# Patient Record
Sex: Male | Born: 1990 | Race: Black or African American | Hispanic: No | Marital: Single | Smoking: Current some day smoker
Health system: Southern US, Community
[De-identification: ages and names within clinical notes are randomized; demographics above are authoritative.]

## PROBLEM LIST (undated history)

## (undated) ENCOUNTER — Emergency Department: Admission: EM | Payer: Self-pay | Source: Home / Self Care

## (undated) DIAGNOSIS — B20 Human immunodeficiency virus [HIV] disease: Secondary | ICD-10-CM

## (undated) HISTORY — PX: WISDOM TOOTH EXTRACTION: SHX21

---

## 2011-11-28 DIAGNOSIS — R7303 Prediabetes: Secondary | ICD-10-CM | POA: Insufficient documentation

## 2011-11-28 DIAGNOSIS — Z21 Asymptomatic human immunodeficiency virus [HIV] infection status: Secondary | ICD-10-CM | POA: Insufficient documentation

## 2012-08-14 ENCOUNTER — Encounter (HOSPITAL_COMMUNITY): Payer: Self-pay | Admitting: *Deleted

## 2012-08-14 ENCOUNTER — Emergency Department (HOSPITAL_COMMUNITY)
Admission: EM | Admit: 2012-08-14 | Discharge: 2012-08-14 | Disposition: A | Discharge status: Home / Self Care | Payer: Managed Care, Other (non HMO) | Attending: Emergency Medicine | Admitting: Emergency Medicine

## 2012-08-14 DIAGNOSIS — F17200 Nicotine dependence, unspecified, uncomplicated: Secondary | ICD-10-CM | POA: Insufficient documentation

## 2012-08-14 DIAGNOSIS — L0231 Cutaneous abscess of buttock: Secondary | ICD-10-CM

## 2012-08-14 MED ORDER — HYDROCODONE-ACETAMINOPHEN 5-325 MG PO TABS
1.0000 | ORAL_TABLET | ORAL | Status: DC | PRN
Start: 2012-08-14 — End: 2012-09-02

## 2012-08-14 NOTE — ED Provider Notes (Signed)
Medical screening examination/treatment/procedure(s) were performed by non-physician practitioner and as supervising physician I was immediately available for consultation/collaboration.  Olivia Mackie, MD 08/14/12 [phone]

## 2012-08-14 NOTE — ED Notes (Signed)
PA Peter at bedside.  

## 2012-08-14 NOTE — ED Provider Notes (Signed)
History     CSN: [id number]  Arrival date & time 08/14/12  0000   First MD Initiated Contact with Patient 08/14/12 0017      Chief Complaint  Patient presents with  . Abscess   HPI  History provided by the patient. Patient is a 22 year old male with no significant PMH who presents with complaints of a boil to his right buttocks. Patient states symptoms first began about a week ago and gradually progressing into a increasing swelling and painful area. Patient states he has had a previous abscess in the past but that this went away after using warm compresses over several days. His current symptoms have not improved with multiple times of warm compresses. Patient denies having any bleeding or drainage from the area. He denies any other aggravating or alleviating factors. Denies any other associated symptoms. Denies fever, chills or sweats.    History reviewed. No pertinent past medical history.  History reviewed. No pertinent past surgical history.  No family history on file.  History  Substance Use Topics  . Smoking status: Current Some Day Smoker  . Smokeless tobacco: Not on file  . Alcohol Use: Yes      Review of Systems  Constitutional: Negative for fever and chills.  All other systems reviewed and are negative.    Allergies  Review of patient's allergies indicates no known allergies.  Home Medications  No current outpatient prescriptions on file.  BP 135/80  Pulse 109  Temp(Src) 98.9 F (37.2 C)  Resp 12  SpO2 98%  Physical Exam  Nursing note and vitals reviewed. Constitutional: He is oriented to person, place, and time. He appears well-developed and well-nourished. No distress.  HENT:  Head: Normocephalic.  Eyes: Conjunctivae are normal.  Cardiovascular: Normal rate and regular rhythm.   Pulmonary/Chest: Effort normal and breath sounds normal.  Musculoskeletal: Normal range of motion.  Neurological: He is alert and oriented to person, place, and time.   Skin: Skin is warm and dry.  4 cm nodular area to the right lateral buttocks with overlying erythema and induration of the skin. Bleeding or drainage.  Psychiatric: He has a normal mood and affect. His behavior is normal.    ED Course  Procedures   INCISION AND DRAINAGE Performed by: Angus Seller Consent: Verbal consent obtained. Risks and benefits: risks, benefits and alternatives were discussed Type: abscess  Body area: Right lateral buttock  Anesthesia: local infiltration  Incision was made with a scalpel.  Local anesthetic: lidocaine percent with epinephrine  Anesthetic total: 6 ml  Complexity: complex Blunt dissection to break up loculations  Drainage: purulent  Drainage amount: Small to moderate   Packing material: None   Patient tolerance: Patient tolerated the procedure well with no immediate complications.       1. Abscess of right buttock       MDM  1:00AM patient seen and evaluated. Patient appears well in no acute distress.  Successful I&D. No significant signs of cellulitis at this time. Will advise patient continue warm compresses and have a recheck of symptoms and 48 hours. Patient also given strict return precautions for worsening symptoms.        Angus Seller, [place] 08/14/12 [phone]

## 2012-08-14 NOTE — ED Notes (Signed)
Boil on rt. Lateral side of butt. 2nd time this month. States, "can it be r/t to drinking juice and no water."

## 2012-09-02 ENCOUNTER — Emergency Department (HOSPITAL_COMMUNITY)
Admission: EM | Admit: 2012-09-02 | Discharge: 2012-09-02 | Disposition: A | Discharge status: Home / Self Care | Payer: Managed Care, Other (non HMO) | Attending: Emergency Medicine | Admitting: Emergency Medicine

## 2012-09-02 ENCOUNTER — Encounter (HOSPITAL_COMMUNITY): Payer: Self-pay | Admitting: *Deleted

## 2012-09-02 DIAGNOSIS — R112 Nausea with vomiting, unspecified: Secondary | ICD-10-CM | POA: Insufficient documentation

## 2012-09-02 DIAGNOSIS — K3189 Other diseases of stomach and duodenum: Secondary | ICD-10-CM | POA: Insufficient documentation

## 2012-09-02 DIAGNOSIS — R1084 Generalized abdominal pain: Secondary | ICD-10-CM | POA: Insufficient documentation

## 2012-09-02 DIAGNOSIS — F17200 Nicotine dependence, unspecified, uncomplicated: Secondary | ICD-10-CM | POA: Insufficient documentation

## 2012-09-02 DIAGNOSIS — K30 Functional dyspepsia: Secondary | ICD-10-CM

## 2012-09-02 MED ORDER — GI COCKTAIL ~~LOC~~
30.0000 mL | Freq: Once | ORAL | Status: AC
  Administered 2012-09-02: 30 mL via ORAL
  Filled 2012-09-02: qty 30

## 2012-09-02 MED ORDER — ONDANSETRON 4 MG PO TBDP
8.0000 mg | ORAL_TABLET | Freq: Once | ORAL | Status: AC
  Administered 2012-09-02: 8 mg via ORAL
  Filled 2012-09-02: qty 2

## 2012-09-02 MED ORDER — ONDANSETRON 4 MG PO TBDP: 8.0000 mg | ORAL_TABLET | Freq: Three times a day (TID) | ORAL | Status: DC | PRN

## 2012-09-02 MED ORDER — DICYCLOMINE HCL 20 MG PO TABS: 20.0000 mg | ORAL_TABLET | Freq: Two times a day (BID) | ORAL | Status: DC

## 2012-09-02 MED ORDER — SODIUM CHLORIDE 0.9 % IV BOLUS (SEPSIS)
1000.0000 mL | Freq: Once | INTRAVENOUS | Status: AC
  Administered 2012-09-02: 1000 mL via INTRAVENOUS

## 2012-09-02 NOTE — ED Provider Notes (Signed)
Medical screening examination/treatment/procedure(s) were performed by non-physician practitioner and as supervising physician I was immediately available for consultation/collaboration.  Olivia Mackie, MD 09/02/12 [phone]

## 2012-09-02 NOTE — ED Notes (Signed)
Pt resting laying on stretcher, family member asking for pt's discharge paperwork, informed family member will have RN to come and speak with them

## 2012-09-02 NOTE — ED Notes (Signed)
The pt has a headache with nv and diarrhea tonight

## 2012-09-02 NOTE — ED Provider Notes (Signed)
History     CSN: [id number]  Arrival date & time 09/02/12  0435   First MD Initiated Contact with Patient 09/02/12 0606      Chief Complaint  Patient presents with  . Headache    (Consider location/radiation/quality/duration/timing/severity/associated sxs/prior treatment) HPI Comments: Patient is a 22 year old male who presents with a history of abdominal pain since last night. The pain is located in his generalized abdomen and does not radiate radiate. The pain is described as cramping and severe. The pain started gradually after he took a BC powder for a headache on an empty stomach and progressively worsened since the onset. No alleviating/aggravating factors. The patient has tried nothing for symptoms without relief. Associated symptoms include nausea and vomiting. Patient denies fever, headache, diarrhea, chest pain, SOB, dysuria, constipation.   History reviewed. No pertinent past medical history.  History reviewed. No pertinent past surgical history.  No family history on file.  History  Substance Use Topics  . Smoking status: Current Some Day Smoker  . Smokeless tobacco: Not on file  . Alcohol Use: Yes      Review of Systems  Gastrointestinal: Positive for nausea, vomiting and abdominal pain.  All other systems reviewed and are negative.    Allergies  Review of patient's allergies indicates no known allergies.  Home Medications  No current outpatient prescriptions on file.  BP 132/73  Pulse 107  Temp(Src) 99.3 F (37.4 C) (Oral)  Resp 16  SpO2 97%  Physical Exam  Nursing note and vitals reviewed. Constitutional: He is oriented to person, place, and time. He appears well-developed and well-nourished. No distress.  HENT:  Head: Normocephalic and atraumatic.  Eyes: Conjunctivae and EOM are normal. Pupils are equal, round, and reactive to light. No scleral icterus.  Neck: Normal range of motion. Neck supple.  Cardiovascular: Normal rate and regular  rhythm.  Exam reveals no gallop and no friction rub.   No murmur heard. Pulmonary/Chest: Effort normal and breath sounds normal. He has no wheezes. He has no rales. He exhibits no tenderness.  Abdominal: Soft. He exhibits no distension. There is tenderness. There is no rebound and no guarding.  Generalized tenderness to palpation.   Musculoskeletal: Normal range of motion.  Neurological: He is alert and oriented to person, place, and time. Coordination normal.  Speech is goal-oriented. Moves limbs without ataxia.   Skin: Skin is warm and dry.  Psychiatric: He has a normal mood and affect. His behavior is normal.    ED Course  Procedures (including critical care time)  Labs Reviewed - No data to display No results found.   1. Stomach upset       MDM  6:24 AM Patient will have IV fluids, zofran, and GI cocktail. Patient tachycardic and afebrile at this time.   8:23 AM Patient feeling better and is ready for discharge. Vitals stable and patient afebrile. Patient likely experienced irritation due to taking BC powder on an empty stomach. No further evaluation needed at this time. Patient instructed to return with worsening or concerning symptoms.       Emilia Beck, [place] 09/02/12 [phone]

## 2013-06-11 ENCOUNTER — Encounter (HOSPITAL_COMMUNITY): Payer: Self-pay | Admitting: Emergency Medicine

## 2013-06-11 ENCOUNTER — Emergency Department (HOSPITAL_COMMUNITY)
Admission: EM | Admit: 2013-06-11 | Discharge: 2013-06-11 | Disposition: A | Discharge status: Home / Self Care | Payer: Managed Care, Other (non HMO) | Attending: Emergency Medicine | Admitting: Emergency Medicine

## 2013-06-11 DIAGNOSIS — F17200 Nicotine dependence, unspecified, uncomplicated: Secondary | ICD-10-CM | POA: Insufficient documentation

## 2013-06-11 DIAGNOSIS — K602 Anal fissure, unspecified: Secondary | ICD-10-CM | POA: Insufficient documentation

## 2013-06-11 DIAGNOSIS — Z79899 Other long term (current) drug therapy: Secondary | ICD-10-CM | POA: Insufficient documentation

## 2013-06-11 MED ORDER — DILTIAZEM GEL 2 %: 1.0000 "application " | Freq: Two times a day (BID) | CUTANEOUS | Status: DC

## 2013-06-11 MED ORDER — PSYLLIUM 28 % PO PACK: 1.0000 | PACK | Freq: Two times a day (BID) | ORAL | Status: DC

## 2013-06-11 MED ORDER — NAPROXEN 500 MG PO TABS: 500.0000 mg | ORAL_TABLET | Freq: Two times a day (BID) | ORAL | Status: DC | PRN

## 2013-06-11 NOTE — ED Notes (Signed)
Pt states he put veet on his anal area 3 days ago and has since had rash. Went to urgent care and was prescribed prednisone and a prescription cream. States it is not helping.

## 2013-06-11 NOTE — ED Provider Notes (Signed)
CSN: [id number]     Arrival date & time 06/11/13  0272 History   First MD Initiated Contact with Patient 06/11/13 [phone]     Chief Complaint  Patient presents with  . Rash   (Consider location/radiation/quality/duration/timing/severity/associated sxs/prior Treatment) HPI  22yM with "a rash on my booty hole." Onset 3-4 days ago. Very painful and significantly worse when defecating. No fever or chills. No drainage. No urinary complaints. No abdominal or back pain. Went to Urgent Care two days ago and given prescription for prednisone and a cream. He is not sure what specifically the cream was nor the diagnosis he was given. Symptoms have not improved.   History reviewed. No pertinent past medical history. History reviewed. No pertinent past surgical history. History reviewed. No pertinent family history. History  Substance Use Topics  . Smoking status: Current Some Day Smoker  . Smokeless tobacco: Not on file  . Alcohol Use: Yes    Review of Systems  All systems reviewed and negative, other than as noted in HPI.   Allergies  Review of patient's allergies indicates no known allergies.  Home Medications   Current Outpatient Rx  Name  Route  Sig  Dispense  Refill  . diltiazem 2 % GEL   Topical   Apply 1 application topically 2 (two) times daily.   30 g   0   . naproxen (NAPROSYN) 500 MG tablet   Oral   Take 1 tablet (500 mg total) by mouth 2 (two) times daily as needed.   20 tablet   0   . psyllium (METAMUCIL SMOOTH TEXTURE) 28 % packet   Oral   Take 1 packet by mouth 2 (two) times daily.   30 packet   0    BP 131/105  Pulse 89  Temp(Src) 97.7 F (36.5 C) (Oral)  Resp 16  SpO2 96% Physical Exam  Nursing note and vitals reviewed. Constitutional: He appears well-developed and well-nourished. No distress.  HENT:  Head: Normocephalic and atraumatic.  Eyes: Conjunctivae are normal. Right eye exhibits no discharge. Left eye exhibits no discharge.  Neck: Neck  supple.  Cardiovascular: Normal rate, regular rhythm and normal heart sounds.  Exam reveals no gallop and no friction rub.   No murmur heard. Pulmonary/Chest: Effort normal and breath sounds normal. No respiratory distress.  Abdominal: Soft. He exhibits no distension. There is no tenderness.  Genitourinary:  Anal fissure at 12 o'clock position. No concerning lesions noted otherwise. Unable to tolerate DRE 2/2 pain.   Musculoskeletal: He exhibits no edema and no tenderness.  Neurological: He is alert.  Skin: Skin is warm and dry.  Psychiatric: He has a normal mood and affect. His behavior is normal. Thought content normal.    ED Course  Procedures (including critical care time) Labs Review Labs Reviewed - No data to display Imaging Review No results found.  EKG Interpretation   None       MDM   1. Anal fissure    22yM with anal pain. Exam consistent with anal fissure. Unsure of reason for previously prescribed prednisone. Instructed to stop as well as "cream" he was given. Additional scripts for bulking agent, NSAID, diltiazem ointment. Sitz baths. Return precautions discussed. Resource list provided.     Raeford Razor, MD 06/11/13 [phone]

## 2013-06-14 ENCOUNTER — Encounter (HOSPITAL_COMMUNITY): Payer: Self-pay | Admitting: Emergency Medicine

## 2013-06-14 ENCOUNTER — Emergency Department (HOSPITAL_COMMUNITY)
Admission: EM | Admit: 2013-06-14 | Discharge: 2013-06-14 | Disposition: A | Discharge status: Home / Self Care | Payer: Managed Care, Other (non HMO) | Attending: Emergency Medicine | Admitting: Emergency Medicine

## 2013-06-14 DIAGNOSIS — IMO0002 Reserved for concepts with insufficient information to code with codable children: Secondary | ICD-10-CM | POA: Insufficient documentation

## 2013-06-14 DIAGNOSIS — F17200 Nicotine dependence, unspecified, uncomplicated: Secondary | ICD-10-CM | POA: Insufficient documentation

## 2013-06-14 DIAGNOSIS — Z792 Long term (current) use of antibiotics: Secondary | ICD-10-CM | POA: Insufficient documentation

## 2013-06-14 DIAGNOSIS — Z79899 Other long term (current) drug therapy: Secondary | ICD-10-CM | POA: Insufficient documentation

## 2013-06-14 DIAGNOSIS — R21 Rash and other nonspecific skin eruption: Secondary | ICD-10-CM | POA: Insufficient documentation

## 2013-06-14 DIAGNOSIS — K6289 Other specified diseases of anus and rectum: Secondary | ICD-10-CM | POA: Insufficient documentation

## 2013-06-14 MED ORDER — MUPIROCIN CALCIUM 2 % EX CREA
TOPICAL_CREAM | Freq: Once | CUTANEOUS | Status: AC
  Administered 2013-06-14: 15:00:00 via TOPICAL
  Filled 2013-06-14: qty 15

## 2013-06-14 MED ORDER — MUPIROCIN CALCIUM 2 % EX CREA: TOPICAL_CREAM | Freq: Two times a day (BID) | CUTANEOUS | Status: DC

## 2013-06-14 NOTE — ED Notes (Signed)
Pt was seen 1 week ago for using VEET shaving cream and was given a cream for this and he is still having burning to penis and rectal area.

## 2013-06-14 NOTE — ED Provider Notes (Signed)
CSN: [id number]     Arrival date & time 06/14/13  1324 History  This chart was scribed for Earley Favor, NP working with Shon Baton, MD by Smiley Houseman, ED Scribe. This patient was seen in room WTR6/WTR6 and the patient's care was started at 1:45PM.    Chief Complaint  Patient presents with  . Anal Rash   The history is provided by the patient. No language interpreter was used.   HPI Comments: Walter Ferguson is a 22 y.o. male who presents to the Emergency Department complaining of pain in his anal area.  Pt states he applied Veet to his anal area about a week ago and developed rash and burning sensations.  He was previously prescribed Prednisone and a topical ointment at [facility] for the rash without relief.    Pt states he has had normal BM.  She continues to have a burning sensation in his rectal area  History reviewed. No pertinent past medical history. History reviewed. No pertinent past surgical history. No family history on file. History  Substance Use Topics  . Smoking status: Current Some Day Smoker  . Smokeless tobacco: Not on file  . Alcohol Use: Yes    Review of Systems  Gastrointestinal: Positive for rectal pain. Negative for diarrhea.  Genitourinary: Negative for dysuria.  Skin: Positive for rash.    Allergies  Review of patient's allergies indicates no known allergies.  Home Medications   Current Outpatient Rx  Name  Route  Sig  Dispense  Refill  . diltiazem 2 % GEL   Topical   Apply 1 application topically 2 (two) times daily.   30 g   0   . naproxen (NAPROSYN) 500 MG tablet   Oral   Take 1 tablet (500 mg total) by mouth 2 (two) times daily as needed.   20 tablet   0   . triamcinolone cream (KENALOG) 0.1 %   Topical   Apply 1 application topically 2 (two) times daily.         . mupirocin cream (BACTROBAN) 2 %   Topical   Apply topically 2 (two) times daily.   15 g   0    Triage Vitals: BP 129/99  Pulse 100  Temp(Src) 97.2 F (36.2 C)  (Oral)  Resp 18  SpO2 100% Physical Exam  Nursing note and vitals reviewed. Constitutional: He appears well-developed and well-nourished.  HENT:  Head: Normocephalic.  Eyes: Pupils are equal, round, and reactive to light.  Neck: Normal range of motion.  Cardiovascular: Normal rate and regular rhythm.   Pulmonary/Chest: Effort normal.  Genitourinary:     Generalized erythema of the anus  Neurological: He is alert.  Skin: Rash noted. There is erythema.    ED Course  Procedures (including critical care time) DIAGNOSTIC STUDIES: Oxygen Saturation is 100% on RA, normal by my interpretation.    COORDINATION OF CARE: 1:33 PM-Will order Bactroban. Patient informed of current plan of treatment and evaluation and agrees with plan.    Labs Review Labs Reviewed - No data to display Imaging Review No results found.  EKG Interpretation   None       MDM   1. Anal inflammation     Use topical Bactroban advised patient to refrain from intercourse until abrasion is healed  I personally performed the services described in this documentation, which was scribed in my presence. The recorded information has been reviewed and is accurate.   Arman Filter, NP 06/14/13 1414

## 2013-06-14 NOTE — ED Provider Notes (Signed)
Medical screening examination/treatment/procedure(s) were performed by non-physician practitioner and as supervising physician I was immediately available for consultation/collaboration.  EKG Interpretation   None        Courtney F Horton, MD 06/14/13 1838 

## 2013-10-25 ENCOUNTER — Emergency Department: Payer: Medicaid Other

## 2013-10-25 ENCOUNTER — Emergency Department
Admission: EM | Admit: 2013-10-25 | Discharge: 2013-10-25 | Disposition: A | Discharge status: Home / Self Care | Payer: Medicaid Other | Attending: Emergency Medicine | Admitting: Emergency Medicine

## 2013-10-25 DIAGNOSIS — R21 Rash and other nonspecific skin eruption: Secondary | ICD-10-CM | POA: Insufficient documentation

## 2013-10-25 MED ORDER — IBUPROFEN 600 MG PO TABS
600.0000 mg | ORAL_TABLET | Freq: Four times a day (QID) | ORAL | Status: AC | PRN
Start: 2013-10-25 — End: ?

## 2013-10-25 NOTE — ED Provider Notes (Signed)
Physician/Midlevel provider first contact with patient: 10/25/13 1346         [facility] EMERGENCY DEPARTMENT   HISTORY AND PHYSICAL EXAM     Physician/Midlevel provider first contact with patient: 10/25/13 1346       Date Time: 10/25/2013 11:51 PM  Patient Name: Walter Young  Attending Physician: Ames Dura, DO  Mid-Level: Timmothy Sours    History of Presenting Illness:   Walter Young is a 23 y.o. male     Chief Complaint: burning rash around anus  History obtained from: Patient .  Onset/Duration: on/off 4 months, worse 2 days   Quality: burning  Severity: mild to moderate  Aggravating Factors: skin rubbing on itself while walking  Alleviating Factors: none  Associated Symptoms: none   Narrative/Additional Historical Findings:22 y.o. male with rash on anus treated first in 4-5 months ago with cream (pt does not know name of cream).  Pt states he noted a rash again 2 days ago which stings and burns when he walks and the skin of his buttocks rubs together.  Denies f/c, n/v/d, penile discharge, dysuria, abd pain, rectal pain, constipation, pain with BM.  Pt has sex with men, insertive and denies receptive anal sex.  Pt has been using OTC spray anesthetic around anus.     PCP: Christa See, MD    Past Medical History:   History reviewed. No pertinent past medical history.    Past Surgical History:   History reviewed. No pertinent past surgical history.    Family History:   History reviewed. No pertinent family history.    Social History:     History     Social History   . Marital Status: Single     Spouse Name: N/A     Number of Children: N/A   . Years of Education: N/A     Social History Main Topics   . Smoking status: Never Smoker    . Smokeless tobacco: Never Used   . Alcohol Use: Yes      Comment: socially   . Drug Use: No   . Sexually Active: Not on file     Other Topics Concern   . Not on file     Social History Narrative   . No narrative on file       Allergies:   No Known  Allergies    Medications:     Patient's Medications   New Prescriptions    IBUPROFEN (ADVIL,MOTRIN) 600 MG TABLET    Take 1 tablet (600 mg total) by mouth every 6 (six) hours as needed for Pain or Fever.   Previous Medications    No medications on file   Modified Medications    No medications on file   Discontinued Medications    No medications on file       Review of Systems:     Review of Systems   Constitutional: Negative for fever.   HENT: Negative for sore throat.    Eyes: Negative for pain, discharge and redness.   Respiratory: Negative for cough, shortness of breath and wheezing.    Cardiovascular: Negative for chest pain and palpitations.   Gastrointestinal: Negative for nausea, vomiting, abdominal pain, diarrhea, constipation and blood in stool.   Genitourinary: Negative for dysuria.        No penile discharge or lesions   Musculoskeletal: Negative for back pain, myalgias and neck pain.   Skin: Positive for rash. Negative for itching.  perianal   Neurological: Negative for dizziness, tingling and headaches.         Physical Exam:     Filed Vitals:    10/25/13 1303   BP: 130/76   Pulse: 96   Temp: 98 F (36.7 C)   TempSrc: Oral   Resp: 18   Weight: 57.607 kg   SpO2: 98%     Pulse Oximetry Analysis - Normal 98% on RA    Physical Exam   Nursing note and vitals reviewed.  Constitutional: He is oriented to person, place, and time and well-developed, well-nourished, and in no distress. No distress.   HENT:   Head: Normocephalic.   Right Ear: Tympanic membrane normal.   Left Ear: Tympanic membrane normal.   Mouth/Throat: Uvula is midline, oropharynx is clear and moist and mucous membranes are normal.   Eyes: Conjunctivae normal are normal. Right eye exhibits no discharge. Left eye exhibits no discharge.   Neck: Normal range of motion. Neck supple.   Cardiovascular: Normal rate and regular rhythm.    Pulmonary/Chest: Effort normal and breath sounds normal.   Abdominal: Soft. There is no tenderness.    Genitourinary: Penis normal. Rectal exam shows no external hemorrhoid, no fissure and no tenderness. Penis exhibits no lesions. No discharge found.        2-3 mm superficial skin excoriation just outside anus, no vesicles, no drainage, nontender.     Musculoskeletal: Normal range of motion. He exhibits no edema and no tenderness.   Lymphadenopathy:     He has no cervical adenopathy.   Neurological: He is alert and oriented to person, place, and time. Gait normal.   Skin: Skin is warm and dry. He is not diaphoretic.        See GU exam   Psychiatric: Affect normal.         Labs:     Results     ** No Results found for the last 24 hours. **          Rads:     Radiology Results (24 Hour)     ** No Results found for the last 24 hours. **              Provider Notes:  Perianal skin excoriation.  Recommend to stop using OTC spray anesthetic on anal skin.  Sitz baths TID, dry area well, antibiotic oint and Desitin.  F/u with primary care provider if not improved in 3-5 days.      Patient counseled on diagnosis, treatment plan, f/u plans, and signs and symptoms with which to return to ED. Pt is stable for discharge.    Assessment/Plan:     1. Rash            CHART OWNERSHIP: I, Ok Edwards, PA-C, am the primary clinician of record.        Signed by: Ok Edwards, PA-C          Timmothy Sours, PA  10/25/13 2358    Ames Dura, DO  11/01/13 2111

## 2013-10-25 NOTE — Discharge Instructions (Signed)
Thank you for choosing [facility] for your emergency care needs.      We appreciate your patience today.     Please feel free to contact us if you have any further questions.     Sitz baths 3 times a day.  Dry area well, gently, with hair dryer on cool setting.  Apply antibiotic oint and Desitin.      Follow up with primary provider if not improved in 3-5 days.  See list attached.     Rash, Nonspecific    You have been seen today for a rash.    The rash does not have a specific appearance or cause that would let the medical staff give a definite diagnosis or treatment now.    There are many causes for rashes to appear on the skin. The medical staff will have talked about some of the many causes. Some causes may be: Allergic reactions, chemical irritation of the skin or infections. A rash alone with no other symptoms is rarely harmful.    If you have some itching you may try an oral (by mouth) antihistamine like diphenhydramine (Benadryl). This is available over the counter (without prescription). Follow the directions and precautions on the medicine packaging.    Follow up with your family doctor or clinic for reevaluation of the rash if it does not clear up in two or three days.    Sometimes, you will need to see a Dermatologist (a skin specialist doctor) for help finding the cause of the rash.    It is OK for you to go home now.    Even though it may be hard, try not to scratch the affected area. A cool wet cloth can help relieve itching and keep you from scratching.    YOU SHOULD SEEK MEDICAL ATTENTION IMMEDIATELY, EITHER HERE OR AT THE NEAREST EMERGENCY DEPARTMENT, IF ANY OF THE FOLLOWING OCCURS:   Rash spreads or gets worse.   Severe itching, pain or burning in the skin.   Blistering, bruising or bleeding skin.   Fever (temperature higher than 100.18F / 38C), headache, vomiting (throwing up).   Any new symptoms which are of concern to you.   Any signs of infection in the skin develop.  This includes more redness, pain, pus drainage, fevers (temperature higher than 100.18F / 38C) or swelling.       Low Cost Healthcare Resources in [facility]/Federally Qualified Health Centers    [facility]   www.https://www.krueger.org/    [facility]ult [facility]  [address]  [place], [place] [address]   [phone]    Pediatrics, [address] [place], [place] [address], [phone]    [facility], [phone]     ---------------------------------------------------------------------------------------  [facility], [address] [place], [place] [address], [phone]  [email or web address].pl    [facility], [address], [place], [place] [address], [phone]         ---------------------------------------------------------------------------------------  (Greater) [facility], [address], [place], [place] [address], [phone], [address] [place], [place] [address], [phone]    ---------------------------------------------------------------------------------------    Health Departments    ---------------------------------------------------------------------------------------    [facility], [address] [place], [place] [address], [phone]  www.[email or web address]    [facility], [address], [place],  [place] [address], [phone]  [email or web address]    ---------------------------------------------------------------------------------------  [facility], [address], [place] [address], [phone]    ---------------------------------------------------------------------------------------  [facility], [address], [place], [place] [address], [phone]    [facility], [address], [place], [place] [address], [phone]  [email or web address]    [facility], [address] [place], [place] [address], [phone], [address], [place], [place] [address], [phone]    [facility], [address]st Floor, [place], [place] [address], [phone]    ---------------------------------------------------------------------------------------  [facility], [address] 1st Floor, [place], [place] [address], [phone]    ---------------------------------------------------------------------------------------  [facility], [address], [place], [place] [address], [phone]    [facility], [phone]    ---------------------------------------------------------------------------------------  [facility], [address], [place], [place] [address], [phone]  [facility], [address], [place], [place] [address], [phone]    [facility], [address] [place], [place] [address], [phone], [address] [place], [place], [place] [address], [phone]    ---------------------------------------------------------------------------------------    Free Clinics    ---------------------------------------------------------------------------------------  ---------------------------------------------------------------------------------------  [facility],   InvestmentInstructor.be    [facility],  http://garcia-smith.com/    [facility],   MobileCamcorder.com.br    [facility], www.TanPlex.uy    [employer], www.vafreeclinics.org/[place]-free-clinics.asp#culc    ---------------------------------------------------------------------------------------    Other Resources    ---------------------------------------------------------------------------------------  ---------------------------------------------------------------------------------------  [facility], [address], [place], [place] [address], [phone]  www.[email or web address].[facility], [address], [address] [place], [place] [address], [phone]  MakePoetry.hu    [facility], [address] [place], [place], [place] [address], [phone]    ---------------------------------------------------------------------------------------    Dental    ---------------------------------------------------------------------------------------  ---------------------------------------------------------------------------------------  [facility], [address], [address] [place], [place] [address], [phone]    [facility], [address], 1st Floor, [place], [place] [address], [phone]  By appointment only    [facility]  [address] [place], [place] [address], [phone]    [facility]  [address] [place], [place], [phone]  [address], [place], [place], [phone]  [address], [place]. [place], [place], [phone]    [facility], [address], [place], [place], [phone]       ---------------------------------------------------------------------------------------  Health Insurance Locators    ---------------------------------------------------------------------------------------  ---------------------------------------------------------------------------------------  [employer] for Health Coverage Education,  http://www.wolf.info/    EHealthInsurance,   http://www.ehealthinsurance.com    [employer],   http://www.healthinsurancesort.com    Health Insurance Online,   http://www.online-health-insurance.com    Health Plan One,   http://www.healthplanone.com    http://www.long-jenkins.com/,    http://www.healthcare.gov    [employer], http://www.novaclinics.org/home  [facility])    ---------------------------------------------------------------------------------------    Medication Resources    ---------------------------------------------------------------------------------------  ---------------------------------------------------------------------------------------  [employer], http://www.fairfaxrxdiscountcard.com, [phone], [address] Helpdesk    Partnership for Prescriptions Assistance,  http://www.pparx.org    NeedyMeds,       http://www.needymeds.[employer],    [phone]    [employer],      http://rojas.com/.htm  [employer] of [employer] (NAIC),      http://www.insureuonline.org    Health Insurance Info Net,     OregonTravelAgents.com.pt      Clinics: STD Testing (free)    Walk-In Hours    [facility] - Monday 9:30 a.m. - 11:00 a.m.     [facility] - Thursday 1:30 p.m.- 3 p.m.     [facility] -   Tuesday 10 a.m. - 11:30 a.m.  Wednesday 5 p.m. - 6:30 p.m.     [facility] -   Monday - 2 p.m.- 3:30 p.m.  Friday - 9:30 a.m. - 11 a.m.     [facility] - Tuesday - 4:30 p.m. - 6 p.m.     Anonymous HIV Testing  Geannie Risen -   First and Third Wednesday 5 p.m. - 6:30 p.m.       [facility] Office:   [address]  [place] [address]  [phone]   [facility]:  [address]  [place], [place] [address]  [phone]     [facility]:  [address]  [place], [place] [address]  [phone]     [facility]  [address]  [place], [place] [address]  [phone]     [facility] Office:   [employer], First Floor, [address]  [address] [place], [place] [address]  [phone]     For more information: http://www.DatingOpportunities.is.htm

## 2013-10-25 NOTE — ED Notes (Signed)
Patient c/o rectal irritation for the last three days. Patient is ao x 3.

## 2014-05-13 ENCOUNTER — Encounter (HOSPITAL_COMMUNITY): Payer: Self-pay | Admitting: Family Medicine

## 2014-05-13 ENCOUNTER — Emergency Department (HOSPITAL_COMMUNITY)
Admission: EM | Admit: 2014-05-13 | Discharge: 2014-05-13 | Disposition: A | Discharge status: Home / Self Care | Payer: Managed Care, Other (non HMO) | Attending: Emergency Medicine | Admitting: Emergency Medicine

## 2014-05-13 DIAGNOSIS — T8089XA Other complications following infusion, transfusion and therapeutic injection, initial encounter: Secondary | ICD-10-CM | POA: Diagnosis not present

## 2014-05-13 DIAGNOSIS — Y848 Other medical procedures as the cause of abnormal reaction of the patient, or of later complication, without mention of misadventure at the time of the procedure: Secondary | ICD-10-CM | POA: Diagnosis not present

## 2014-05-13 DIAGNOSIS — R52 Pain, unspecified: Secondary | ICD-10-CM

## 2014-05-13 DIAGNOSIS — M25551 Pain in right hip: Secondary | ICD-10-CM | POA: Diagnosis not present

## 2014-05-13 DIAGNOSIS — Z720 Tobacco use: Secondary | ICD-10-CM | POA: Diagnosis not present

## 2014-05-13 DIAGNOSIS — M25552 Pain in left hip: Secondary | ICD-10-CM | POA: Diagnosis present

## 2014-05-13 DIAGNOSIS — Z79899 Other long term (current) drug therapy: Secondary | ICD-10-CM | POA: Insufficient documentation

## 2014-05-13 HISTORY — DX: Human immunodeficiency virus (HIV) disease: B20

## 2014-05-13 MED ORDER — IBUPROFEN 800 MG PO TABS: 800.0000 mg | ORAL_TABLET | Freq: Three times a day (TID) | ORAL | Status: DC

## 2014-05-13 MED ORDER — IBUPROFEN 400 MG PO TABS
800.0000 mg | ORAL_TABLET | Freq: Once | ORAL | Status: AC
  Administered 2014-05-13: 800 mg via ORAL
  Filled 2014-05-13: qty 2

## 2014-05-13 NOTE — Discharge Instructions (Signed)
Muscle Pain Muscle pain may be mild or severe. In most cases, the pain lasts only a short time and goes away without treatment. To diagnose the cause of your muscle pain, your health care provider will take your medical history. This means he or she will ask you when your muscle pain began and what has been happening. If you have not had muscle pain for very long, your health care provider may want to wait before doing much testing. If your muscle pain has lasted a long time, your health care provider may want to run tests right away. If your health care provider thinks your muscle pain may be caused by illness, you may need to have additional tests to rule out certain conditions.  Treatment for muscle pain depends on the cause. Home care is often enough to relieve muscle pain. Your health care provider may also prescribe anti-inflammatory medicine. HOME CARE INSTRUCTIONS Watch your condition for any changes. The following actions may help to lessen any discomfort you are feeling:  Only take over-the-counter or prescription medicines as directed by your health care provider.  Apply ice to the sore muscle:  Put ice in a plastic bag.  Place a towel between your skin and the bag.  Leave the ice on for 15-20 minutes, 3-4 times a day.  You may alternate applying hot and cold packs to the muscle as directed by your health care provider.  If overuse is causing your muscle pain, slow down your activities until the pain goes away.  Remember that it is normal to feel some muscle pain after starting a workout program. Muscles that have not been used often will be sore at first.  Do regular, gentle exercises if you are not usually active.  Warm up before exercising to lower your risk of muscle pain.  Do not continue working out if the pain is very bad. Bad pain could mean you have injured a muscle. SEEK MEDICAL CARE IF:  Your muscle pain gets worse, and medicines do not help.  You have muscle pain  that lasts longer than 3 days.  You have a rash or fever along with muscle pain.  You have muscle pain after a tick bite.  You have muscle pain while working out, even though you are in good physical condition.  You have redness, soreness, or swelling along with muscle pain.  You have muscle pain after starting a new medicine or changing the dose of a medicine. SEEK IMMEDIATE MEDICAL CARE IF:  You have trouble breathing.  You have trouble swallowing.  You have muscle pain along with a stiff neck, fever, and vomiting.  You have severe muscle weakness or cannot move part of your body. MAKE SURE YOU:   Understand these instructions.  Will watch your condition.  Will get help right away if you are not doing well or get worse. Document Released: 05/02/2006 Document Revised: 06/15/2013 Document Reviewed: 04/06/2013 [facility] Patient Information 2015 [place], [place]. This information is not intended to replace advice given to you by your health care provider. Make sure you discuss any questions you have with your health care provider.

## 2014-05-13 NOTE — ED Provider Notes (Signed)
CSN: [id number]     Arrival date & time 05/13/14  1810 History   None    Chief Complaint  Patient presents with  . Hip Pain   Patient is a 23 y.o. male presenting with hip pain. The history is provided by the patient. No language interpreter was used.  Hip Pain This is a new problem. The current episode started yesterday. The problem occurs constantly. The problem has not changed since onset.Nothing relieves the symptoms.   This chart was scribed for nurse practitioner Felicie Mornavid Kyrell Ruacho, NP working with Rolland PorterMark James, MD, by Andrew Auaven Small, ED Scribe. This patient was seen in room TR10C/TR10C and the patient's care was started at 6:53 PM.  Orion Crookntonio Mcwright is a 23 y.o. male who presents to the Emergency Department complaining of bilateral hip pain. Pt states he received 2 penicillin shots yesterday for syphilis [facility] and is now having hip pain. Pt reports pain began last night and has worsening pain with movement and touch. Pt has taken ibuprofen without relief to pain. Pt states he has had the same injection in the past and denies similar pain from past injection.   History reviewed. No pertinent past medical history. History reviewed. No pertinent past surgical history. History reviewed. No pertinent family history. History  Substance Use Topics  . Smoking status: Current Some Day Smoker  . Smokeless tobacco: Not on file  . Alcohol Use: Yes    Review of Systems  Musculoskeletal: Positive for myalgias and arthralgias.  All other systems reviewed and are negative.  Allergies  Review of patient's allergies indicates no known allergies.  Home Medications   Prior to Admission medications   Medication Sig Start Date End Date Taking? Authorizing Provider  diltiazem 2 % GEL Apply 1 application topically 2 (two) times daily. 06/11/13   Raeford RazorStephen Kohut, MD  mupirocin cream (BACTROBAN) 2 % Apply topically 2 (two) times daily. 06/14/13   Arman FilterGail K Schulz, NP  naproxen  (NAPROSYN) 500 MG tablet Take 1 tablet (500 mg total) by mouth 2 (two) times daily as needed. 06/11/13   Raeford RazorStephen Kohut, MD  triamcinolone cream (KENALOG) 0.1 % Apply 1 application topically 2 (two) times daily.    Historical Provider, MD   Triage Vitals; BP 115/65 mmHg  Pulse 120  Temp(Src) 98 F (36.7 C) (Oral)  Resp 20  Ht 5\' 8"  (1.727 m)  Wt 127 lb 4.8 oz (57.743 kg)  BMI 19.36 kg/m2  SpO2 96% Physical Exam  Constitutional: He is oriented to person, place, and time. He appears well-developed and well-nourished. No distress.  HENT:  Head: Normocephalic and atraumatic.  Eyes: Conjunctivae and EOM are normal.  Neck: Neck supple.  Cardiovascular: Normal rate.   Pulmonary/Chest: Effort normal.  Musculoskeletal: Normal range of motion.  Pt has tenderness to the outer quadrants of the gluteus  from an injection yesterday  Neurological: He is alert and oriented to person, place, and time.  Skin: Skin is warm and dry.  Psychiatric: He has a normal mood and affect. His behavior is normal.  Nursing note and vitals reviewed.   ED Course  Procedures (including critical care time) DIAGNOSTIC STUDIES: Oxygen Saturation is 96% on RA, normal by my interpretation.    COORDINATION OF CARE: 7:03 PM- Pt advised of plan for treatment and pt agrees. Labs Review Labs Reviewed - No data to display  Imaging Review No results found.   EKG Interpretation None     Patient received IM  injections in bilateral hips (UOQ) yesterday for STD.  Today is having pain at the injection sites.  No erythema or warmth noted at sites.  Motrin prescribed for pain. Review of outside records shows patient with mild persistent tachycardia on numerous visits. Return precautions discussed. MDM   Final diagnoses:  None   Pain at injection site.  I personally performed the services described in this documentation, which was scribed in my presence. The recorded information has been reviewed and is  accurate.      Jimmye Normanavid John Loriene Taunton, NP 05/13/14 1952  Rolland PorterMark James, MD 05/18/14 [phone]

## 2014-05-13 NOTE — ED Notes (Signed)
Per pt sts that he received a shot yesterday in both hips and now is having pain. sts abx shot for STD

## 2014-10-13 DIAGNOSIS — F341 Dysthymic disorder: Secondary | ICD-10-CM | POA: Insufficient documentation

## 2015-04-15 ENCOUNTER — Encounter (HOSPITAL_COMMUNITY): Payer: Self-pay | Admitting: Emergency Medicine

## 2015-04-15 ENCOUNTER — Emergency Department (HOSPITAL_COMMUNITY)
Admission: EM | Admit: 2015-04-15 | Discharge: 2015-04-15 | Disposition: A | Discharge status: Home / Self Care | Payer: Managed Care, Other (non HMO) | Attending: Emergency Medicine | Admitting: Emergency Medicine

## 2015-04-15 DIAGNOSIS — Z792 Long term (current) use of antibiotics: Secondary | ICD-10-CM | POA: Insufficient documentation

## 2015-04-15 DIAGNOSIS — Z79899 Other long term (current) drug therapy: Secondary | ICD-10-CM | POA: Diagnosis not present

## 2015-04-15 DIAGNOSIS — Z791 Long term (current) use of non-steroidal anti-inflammatories (NSAID): Secondary | ICD-10-CM | POA: Diagnosis not present

## 2015-04-15 DIAGNOSIS — Z7952 Long term (current) use of systemic steroids: Secondary | ICD-10-CM | POA: Insufficient documentation

## 2015-04-15 DIAGNOSIS — B20 Human immunodeficiency virus [HIV] disease: Secondary | ICD-10-CM | POA: Diagnosis not present

## 2015-04-15 DIAGNOSIS — R197 Diarrhea, unspecified: Secondary | ICD-10-CM

## 2015-04-15 DIAGNOSIS — R1012 Left upper quadrant pain: Secondary | ICD-10-CM | POA: Insufficient documentation

## 2015-04-15 DIAGNOSIS — R109 Unspecified abdominal pain: Secondary | ICD-10-CM

## 2015-04-15 LAB — URINALYSIS, ROUTINE W REFLEX MICROSCOPIC
Bilirubin Urine: NEGATIVE
Glucose, UA: NEGATIVE mg/dL
HGB URINE DIPSTICK: NEGATIVE
Ketones, ur: 40 mg/dL — AB
LEUKOCYTES UA: NEGATIVE
NITRITE: NEGATIVE
PROTEIN: NEGATIVE mg/dL
Specific Gravity, Urine: 1.035 — ABNORMAL HIGH (ref 1.005–1.030)
UROBILINOGEN UA: 1 mg/dL (ref 0.0–1.0)
pH: 6 (ref 5.0–8.0)

## 2015-04-15 LAB — CBC WITH DIFFERENTIAL/PLATELET
BASOS ABS: 0 10*3/uL (ref 0.0–0.1)
Basophils Relative: 0 %
Eosinophils Absolute: 0.1 10*3/uL (ref 0.0–0.7)
Eosinophils Relative: 2 %
HEMATOCRIT: 42.6 % (ref 39.0–52.0)
HEMOGLOBIN: 14.6 g/dL (ref 13.0–17.0)
LYMPHS PCT: 22 %
Lymphs Abs: 2.1 10*3/uL (ref 0.7–4.0)
MCH: 31.5 pg (ref 26.0–34.0)
MCHC: 34.3 g/dL (ref 30.0–36.0)
MCV: 91.8 fL (ref 78.0–100.0)
MONO ABS: 0.8 10*3/uL (ref 0.1–1.0)
Monocytes Relative: 9 %
NEUTROS ABS: 6.2 10*3/uL (ref 1.7–7.7)
NEUTROS PCT: 67 %
Platelets: 279 10*3/uL (ref 150–400)
RBC: 4.64 MIL/uL (ref 4.22–5.81)
RDW: 12.6 % (ref 11.5–15.5)
WBC: 9.2 10*3/uL (ref 4.0–10.5)

## 2015-04-15 LAB — COMPREHENSIVE METABOLIC PANEL
ALK PHOS: 66 U/L (ref 38–126)
ALT: 26 U/L (ref 17–63)
AST: 42 U/L — AB (ref 15–41)
Albumin: 4.5 g/dL (ref 3.5–5.0)
Anion gap: 6 (ref 5–15)
BILIRUBIN TOTAL: 1 mg/dL (ref 0.3–1.2)
BUN: 12 mg/dL (ref 6–20)
CALCIUM: 9.1 mg/dL (ref 8.9–10.3)
CO2: 26 mmol/L (ref 22–32)
CREATININE: 1.06 mg/dL (ref 0.61–1.24)
Chloride: 106 mmol/L (ref 101–111)
GFR calc Af Amer: >60 mL/min (ref >60)
GLUCOSE: 80 mg/dL (ref 65–99)
POTASSIUM: 3.6 mmol/L (ref 3.5–5.1)
Sodium: 138 mmol/L (ref 135–145)
TOTAL PROTEIN: 8.3 g/dL — AB (ref 6.5–8.1)

## 2015-04-15 LAB — LIPASE, BLOOD: LIPASE: 37 U/L (ref 11–51)

## 2015-04-15 MED ORDER — GI COCKTAIL ~~LOC~~
30.0000 mL | Freq: Once | ORAL | Status: AC
  Administered 2015-04-15: 30 mL via ORAL
  Filled 2015-04-15: qty 30

## 2015-04-15 MED ORDER — LOPERAMIDE HCL 2 MG PO CAPS: 2.0000 mg | ORAL_CAPSULE | Freq: Four times a day (QID) | ORAL | Status: DC | PRN

## 2015-04-15 NOTE — Discharge Instructions (Signed)
Take your medication as prescribed as needed for diarrhea. Follow-up with a primary care provider listed in the resource guide provided below in the next 4-5 days. Return to the emergency department if symptoms worsen or new onset of fever, vomiting, blood in stool or emesis, urinary symptoms.   Emergency Department Resource Guide 1) Find a Doctor and Pay Out of Pocket Although you won't have to find out who is covered by your insurance plan, it is a good idea to ask around and get recommendations. You will then need to call the office and see if the doctor you have chosen will accept you as a new patient and what types of options they offer for patients who are self-pay. Some doctors offer discounts or will set up payment plans for their patients who do not have insurance, but you will need to ask so you aren't surprised when you get to your appointment.  2) Contact Your Local Health Department Not all health departments have doctors that can see patients for sick visits, but many do, so it is worth a call to see if yours does. If you don't know where your local health department is, you can check in your phone book. The CDC also has a tool to help you locate your state's health department, and many state websites also have listings of all of their local health departments.  3) Find a Walk-in Clinic If your illness is not likely to be very severe or complicated, you may want to try a walk in clinic. These are popping up all over the country in pharmacies, drugstores, and shopping centers. They're usually staffed by nurse practitioners or physician assistants that have been trained to treat common illnesses and complaints. They're usually fairly quick and inexpensive. However, if you have serious medical issues or chronic medical problems, these are probably not your best option.  No Primary Care Doctor: - Call Health Connect at  [phone] - they can help you locate a primary care doctor that  accepts  your insurance, provides certain services, etc. - Physician Referral Service- [phone]  Chronic Pain Problems: Organization         Address  Phone   Notes  [facility]  [phone] Patients need to be referred by their primary care doctor.   Medication Assistance: Organization         Address  Phone   Notes  [facility] [address] [place], [place] [address] [phone] --Must be a resident of [facility] -- Must have NO insurance coverage whatsoever (no Medicaid/ Medicare, etc.) -- The pt. MUST have a primary care doctor that directs their care regularly and follows them in the community   [facility]ist  [phone]   [employer]  [phone]) [phone]    Agencies that provide inexpensive medical care: Organization         Address  Phone   Notes  [facility]  [phone]   [facility]    [phone]) [phone]   [facility] [address] [place], [place] [address] [phone]) [phone]   [facility] [address], [place] [phone]   [facility]    [phone]   [facility]    [phone]   [facility]  [address], [place] Phone:  [phone], Fax:  [phone] Hours of Operation:  9 am -  6 pm, M-F.  Also accepts Medicaid/Medicare and self-pay.  °[facility] ° [address], [place] Phone: [phone], Fax: [phone]. Hours of Operation:  8:30 am - 5:30 pm, M-F.  Also accepts Medicaid and self-pay.  °[facility] [place] [address], [place] Phone: ([phone]) [phone]   °[facility] [address], [place], [place] ([phone], Ext. 123 Mondays & Thursdays: 7-9 AM.  First 15 patients are seen on a first come, first serve basis. °  ° °Medicaid-accepting [place] Providers: ° °Organization          Address  Phone   Notes  °[facility] [address], [place] [phone]) [phone] Also accepts self-pay patients.  °[facility] [address], [place] ° ([phone]) [phone]   °[facility] [address], [place] ([phone]) [phone]   °[facility] [address] [place] [address], [place] ([phone]) [phone]   °Veita Bland [address], [place]  ° ([phone]) [phone] Only accepts [place] Access Medicaid patients after they have their name applied to their card.  ° °Self-Pay (no insurance) in [place]: ° °Organization         Address  Phone   Notes  °Sickle Cell Patients, [facility] [address], [place] ([phone]) [phone]   °[facility] [address], [place] ([phone]) [phone]   °[facility] ° [address], [place] ([phone]) [phone]   °[facility]/Dr. Osei-Bonsu ° [address] [place] [address], [place] or [address], [place] [phone]) [phone] Phone number for both [place] and [place] locations is the same.  °[facility] [address], [place] ([phone]) [phone]   °[facility] [address] [place] [address], [place] or [address] (336) [phone] °(336) [phone]   °[facility] [address], [place] (336) [phone], phone; ([phone]) [phone], fax Sees patients 1st and 3rd Saturday of every month.  Must not qualify for public or private insurance (i.e. Medicaid, Medicare, [place] Health Choice, Veterans' Benefits) • Household income should be no more than 200% of the poverty level •The clinic cannot treat you if you are pregnant or think you are pregnant • Sexually transmitted diseases are not treated at the clinic.  ° ° °Dental Care: °Organization         Address  Phone  Notes  °[facility] [address],  [place] ([phone]) [phone] Accepts children up to age 21 who are enrolled in Medicaid or [place] Health Choice; pregnant women with a Medicaid card; and children who have applied for Medicaid or [place] Health Choice, but were declined, whose parents can pay a reduced fee at time of service.  °[facility] [place]  [address], [place] (336) [phone] Accepts children up to age 21 who are enrolled in Medicaid or [place] Health Choice; pregnant women with a Medicaid card; and children who have applied for Medicaid or [place] Health Choice, but were declined, whose parents can pay a reduced fee at time of service.  °[facility] ° [address],  ([phone]) [phone] Patients are seen by appointment only. Walk-ins are not accepted. [facility] will see patients 18 years of age and   older. °Monday - Tuesday (8am-5pm) °Most Wednesdays (8:30-5pm) °$30 per visit, cash only  °[facility] ° [address], [place] ([phone]) [phone] Patients are seen by appointment only. Walk-ins are not accepted. [facility] will see patients 18 years of age and older. °One Wednesday Evening (Monthly: Volunteer Based).  $30 per visit, cash only  °[facility]  ([phone]) [phone] for adults; Children under age 4, call Graduate Pediatric Dentistry at [phone]. Children aged 4-14, please call [phone]) [phone] to request a pediatric application. ° Dental services are provided in all areas of dental care including fillings, crowns and bridges, complete and partial dentures, implants, gum treatment, root canals, and extractions. Preventive care is also provided. Treatment is provided to both adults and children. °Patients are selected via a lottery and there is often a waiting list. °  °[facility] [address], °[place] ° (336) [phone] [email or web address] °  °[facility] [address], [place], [place]  (336)723-1848, Ext. 123 Second and Fourth Thursday of each month, opens at 6:30 AM; Clinic ends at 9 AM.  Patients are seen on a first-come first-served basis, and a limited number are seen during each clinic.  ° °[facility] ° [address], [place], [place] ([phone]) [phone]   Eligibility Requirements °You must have lived in [place], [place], or [place] counties for at least the last three months. °  You cannot be eligible for state or federal sponsored healthcare insurance, including Veterans Administration, Medicaid, or Medicare. °  You generally cannot be eligible for healthcare insurance through your employer.  °  How to apply: °Eligibility screenings are held every Tuesday and Wednesday afternoon from 1:00 pm until 4:00 pm. You do not need an appointment for the interview!  °[facility] [address], [place], [place] [phone]   °[facility]  336-342-8273   °[facility]  [phone]   °[facility]  336-570-6415   ° °Behavioral Health Resources in the Community: °Intensive Outpatient Programs °Organization         Address  Phone  Notes  °[facility] [address], [place], [place] [phone]   °[facility] [address], [place], [place] [phone]   °[facility] [address], [place], [place] ° 336-882-2125   °[facility] [address],  °[place], [place] 1-800-853-5163 or 336-641-4981   °Substance Abuse Resources °Organization         Address  Phone  Notes  °[facility]  336-882-2125   °[facility]  336-784-9470   °[facility]  336-285-9073   °[facility]  336-845-3988   °Residential & Outpatient Substance Abuse Program  1-800-659-3381   °Psychological Services °Organization         Address  Phone  Notes  °[facility]  336- [phone]   °[facility]  336- 378-7881    °[facility] [address], [place] 1-800-853-5163 or 336-641-4981   ° °Mobile Crisis Teams °Organization         Address  Phone  Notes  °[facility], Mobile Crisis Care Unit  1-877-626-1772   °Assertive °[facility] ° [address] [place], [place] 336-834-9664   °Sharon DeEsch [address] °[place] 336-554-5454   ° °Self-Help/Support Groups °Organization         Address    Phone             Notes  [facility] [place] - variety of support groups  [place] Call for more information  Narcotics Anonymous (NA), [facility] [address], [employer] [place]  2 meetings at this location   Special educational needs teacher         Address  Phone  Notes  [facility] [place],    [place]  1-910-336-2542   [facility]  [address], [place] [address], [place], [place]   [place], [place] [phone] Admissions: 8am-3pm M-F  [facility] Substance [place] [address]    [place], [place] [phone]   [facility] [address] [place], [place], [place]   [facility] [address]  [place], [place]   [facility] - Intensive Outpatient [address], [place], [place]   [facility] [place]  [place], [place] 1-479-827-6100 or [phone]   [facility] ([facility] [address] [place], [place] Accepts Medicaid  [facility] [address],  [place] [phone] Substance Abuse/Addiction Treatment   [facility] Organization         Address  Phone  Notes  [facility]  7271434808   Domenic Schwab, PhD [address] [place], [place]   (401) [phone] or [phone]   [place], [place] [phone])[phone]   [facility] [address], [place], [place] [phone] Insurance/Medicaid/sponsorship through [facility] [address] [place]                                    [place], [place] [phone] [place] [address] [place], [place] [phone]    Dr. Adele Schilder  [phone]   [facility] Dept. 1) [address] [place] 2) [place] 3)  [address], [place] [phone]  [phone]   [place] [phone]) [phone] or [phone] (After Hours)

## 2015-04-15 NOTE — ED Notes (Signed)
Pt reports abdominal cramping since Saturday.

## 2015-04-15 NOTE — ED Notes (Signed)
Per [place] collect stool culture prior to discharge.

## 2015-04-15 NOTE — ED Provider Notes (Signed)
CSN: [id number]     Arrival date & time 04/15/15  [id number] History   First MD Initiated Contact with Patient 04/15/15 0932     Chief Complaint  Patient presents with  . Abdominal Pain     (Consider location/radiation/quality/duration/timing/severity/associated sxs/prior Treatment) HPI Comments: Patient is a 24 year old male with past medical history of HIV who presents to the ED with complaint of abdominal cramping, onset one week. Patient reports having constant cramping to his left upper quadrant. Endorses nausea and multiple episodes of diarrhea daily. Denies fever, chills, headache, sore throat, SOB, CP, vomiting, urinary sxs, blood in urine or stool. He states he was seen at an urgent care 5 days ago and was advised to take over-the-counter antidiarrheal meds. He notes he has taken the medicine without relief. Denies any sick contacts. Denies any recent camping, hiking or drinking from any freshwater sources. He states he is compliant with his HIV meds and last saw his doctor a month ago.  Patient is a 24 y.o. male presenting with abdominal pain.  Abdominal Pain Associated symptoms: diarrhea     Past Medical History  Diagnosis Date  . HIV (human immunodeficiency virus infection) [facility](HCC)    Past Surgical History  Procedure Laterality Date  . Wisdom tooth extraction     No family history on file. Social History  Substance Use Topics  . Smoking status: Never Smoker   . Smokeless tobacco: None  . Alcohol Use: Yes    Review of Systems  Gastrointestinal: Positive for abdominal pain and diarrhea.  All other systems reviewed and are negative.     Allergies  Review of patient's allergies indicates no known allergies.  Home Medications   Prior to Admission medications   Medication Sig Start Date End Date Taking? Authorizing Provider  diltiazem 2 % GEL Apply 1 application topically 2 (two) times daily. 06/11/13   Raeford RazorStephen Kohut, MD  ibuprofen (ADVIL,MOTRIN) 800 MG tablet Take 1  tablet (800 mg total) by mouth 3 (three) times daily. 05/13/14   Felicie Mornavid Smith, NP  mupirocin cream (BACTROBAN) 2 % Apply topically 2 (two) times daily. 06/14/13   Earley FavorGail Schulz, NP  naproxen (NAPROSYN) 500 MG tablet Take 1 tablet (500 mg total) by mouth 2 (two) times daily as needed. 06/11/13   Raeford RazorStephen Kohut, MD  triamcinolone cream (KENALOG) 0.1 % Apply 1 application topically 2 (two) times daily.    Historical Provider, MD   BP 126/74 mmHg  Pulse 95  Temp(Src) 97.5 F (36.4 C) (Oral)  Resp 16  SpO2 100% Physical Exam  Constitutional: He is oriented to person, place, and time. He appears well-developed and well-nourished. No distress.  HENT:  Head: Normocephalic and atraumatic.  Mouth/Throat: Oropharynx is clear and moist. No oropharyngeal exudate.  Eyes: Conjunctivae and EOM are normal. Right eye exhibits no discharge. Left eye exhibits no discharge. No scleral icterus.  Neck: Normal range of motion. Neck supple.  Cardiovascular: Normal rate, regular rhythm, normal heart sounds and intact distal pulses.   No murmur heard. Pulmonary/Chest: Effort normal and breath sounds normal. No respiratory distress. He has no wheezes. He has no rales. He exhibits no tenderness.  Abdominal: Soft. Bowel sounds are normal. He exhibits no distension and no mass. Tenderness: mild LLQ TTP. There is no rebound and no guarding.  Musculoskeletal: Normal range of motion. He exhibits no edema.  Lymphadenopathy:    He has no cervical adenopathy.  Neurological: He is alert and oriented to person, place, and time.  Skin: Skin is warm  and dry.  Nursing note and vitals reviewed.   ED Course  Procedures (including critical care time) Labs Review Labs Reviewed - No data to display  Imaging Review No results found. I have personally reviewed and evaluated these images and lab results as part of my medical decision-making.  Filed Vitals:   04/15/15 1158  BP: 119/82  Pulse: 85  Temp:   Resp: 15     MDM    Final diagnoses:  Diarrhea, unspecified type  Abdominal pain, unspecified abdominal location    Patient presents with a week long history of abdominal cramping, nausea, diarrhea x 1week. Denies fever. PMH HIV, compliant with medications, most recent CD4 count at baseline. Patient was seen at an urgent care approximately 5 days ago and was advised to take over-the-counter antidiarrheals. VSS, afebrile. Exam revealed mild LLQ TTP, no peritoneal signs. Remaining exam benign. Pt given GI cocktail. Labs unremarkable. Pt reports relief of abdominal pain.  patient states that he has had more stress recently related to his living situation and notes that he thinks this may be causing his diarrhea. Stool sample obtained for culture. I suspect symptoms are likely due to viral gastroenteritis. I do not feel that any further workup or imaging is warranted at this time.  Plan to discharge patient home. Advised patient to continue taking antimotility agent. Patient given resource guide to follow up with primary care provider.   Evaluation does not show pathology requring ongoing emergent intervention or admission. Pt is hemodynamically stable and mentating appropriately. Discussed findings/results and plan with patient/guardian, who agrees with plan. All questions answered. Return precautions discussed and outpatient follow up given.    Satira Sark Harris, [place] 04/15/15 1445  Arby Barrette, MD 04/24/15 [phone]

## 2015-04-19 LAB — STOOL CULTURE

## 2015-11-15 ENCOUNTER — Encounter: Payer: Self-pay | Admitting: Podiatry

## 2015-11-23 NOTE — Progress Notes (Signed)
This encounter was created in error - please disregard.

## 2015-11-29 ENCOUNTER — Ambulatory Visit: Payer: Self-pay

## 2015-11-29 ENCOUNTER — Emergency Department (HOSPITAL_COMMUNITY)
Admission: EM | Admit: 2015-11-29 | Discharge: 2015-11-29 | Disposition: A | Discharge status: Home / Self Care | Payer: Managed Care, Other (non HMO) | Attending: Emergency Medicine | Admitting: Emergency Medicine

## 2015-11-29 ENCOUNTER — Encounter: Payer: Self-pay | Admitting: Podiatry

## 2015-11-29 ENCOUNTER — Ambulatory Visit (INDEPENDENT_AMBULATORY_CARE_PROVIDER_SITE_OTHER): Payer: Managed Care, Other (non HMO) | Admitting: Podiatry

## 2015-11-29 ENCOUNTER — Ambulatory Visit (INDEPENDENT_AMBULATORY_CARE_PROVIDER_SITE_OTHER): Payer: Managed Care, Other (non HMO)

## 2015-11-29 ENCOUNTER — Encounter (HOSPITAL_COMMUNITY): Payer: Self-pay

## 2015-11-29 VITALS — BP 103/73 | HR 92 | Resp 12 | Ht 69.0 in | Wt 130.0 lb

## 2015-11-29 DIAGNOSIS — M779 Enthesopathy, unspecified: Secondary | ICD-10-CM | POA: Diagnosis not present

## 2015-11-29 DIAGNOSIS — Z5321 Procedure and treatment not carried out due to patient leaving prior to being seen by health care provider: Secondary | ICD-10-CM | POA: Insufficient documentation

## 2015-11-29 DIAGNOSIS — M79672 Pain in left foot: Secondary | ICD-10-CM

## 2015-11-29 DIAGNOSIS — R0981 Nasal congestion: Secondary | ICD-10-CM | POA: Diagnosis present

## 2015-11-29 DIAGNOSIS — M79671 Pain in right foot: Secondary | ICD-10-CM | POA: Diagnosis not present

## 2015-11-29 NOTE — ED Notes (Addendum)
Patient decided to leave, he couldn't stay.  Dr. Clydene PughKnott stated put left without being seen.

## 2015-11-29 NOTE — Progress Notes (Signed)
   Subjective:    Patient ID: Walter Ferguson, male    DOB: 1990/11/12, 25 y.o.   MRN: [id number]  HPI "This place on my left big toe needs to be gone.  It rubs my shoes.  Back in the day I was wearing small shoes trying to be cute.  I went to a doctor before and he gave me some cream.  It didn't do anything.  I need it shaved down or something. It's been there about 2-3 years."    Review of Systems  Allergic/Immunologic: Positive for environmental allergies.  All other systems reviewed and are negative.      Objective:   Physical Exam        Assessment & Plan:

## 2015-11-29 NOTE — Progress Notes (Signed)
Subjective:     Patient ID: Orion Crookntonio Risenhoover, male   DOB: Sep 14, 1990, 25 y.o.   MRN: [id number]  HPI patient states I have this spur on top of my big toe left that bothers me and I cannot wear my shoes comfortably and I have to have it taken off. It's been present for several years and I've tried wider shoes I tried padding and Band-Aids without relief   Review of Systems  All other systems reviewed and are negative.      Objective:   Physical Exam  Constitutional: He is oriented to person, place, and time.  Cardiovascular: Intact distal pulses.   Musculoskeletal: Normal range of motion.  Neurological: He is oriented to person, place, and time.  Skin: Skin is warm.  Nursing note and vitals reviewed.  neurovascular status intact muscle strength adequate range of motion within normal limits with patient found to have a spur at the interphalangeal joint left big toe that does get red and irritated. Patient is HIV positive but does not have any current symptoms and states that shoe gear is becoming increasingly an issue for him     Assessment:     Spur formation interphalangeal joint left hallux with pain    Plan:     H&P condition reviewed and discussed treatment options. He's tried padding and wider shoes and he would prefer just to have it removed and I explained procedure to shave this off explaining it will require incision and it may or may not give him a completely flat appearance. Patient wants the surgery understanding this and wants to do consent form now due to his schedule and I allowed him to read consent form reviewing alternative treatments and complications. He understands he'll be and surgical shoe for approximately 3 weeks and the told recovery. Can take 6 months any signs consent form at this time scheduled for outpatient surgery and encouraged to call with questions  X-ray taken today indicates there is probability of osteochondral spurring of the interphalangeal joint left  big toe

## 2015-11-29 NOTE — ED Notes (Signed)
Patient decided to leave.   

## 2015-11-29 NOTE — ED Provider Notes (Signed)
Patient left without being seen, I did not see or evaluate them.   Lyndal Pulleyaniel Jaydalyn Demattia, MD 11/29/15 [phone]

## 2015-11-29 NOTE — Patient Instructions (Signed)

## 2015-11-29 NOTE — ED Notes (Signed)
Pt c/o nasal congestion and headache x "a couple" days.  Pain score 8/10.  Pt has not taken anything for symptoms.  Pt reports that he was seen by an Urgent Care "a few days ago" d/t a sore throat and was prescribed amoxicillin.  Sore throat has resolved.  Hx of HIV.

## 2015-12-22 ENCOUNTER — Telehealth: Payer: Self-pay | Admitting: *Deleted

## 2015-12-22 NOTE — Telephone Encounter (Signed)
I attempted to call patient.  [facility] stated patient informed them that he had rescheduled his surgery to July 21.  I was calling to inform him we have him scheduled for July 11.  Dr. Charlsie Merlesegal does not do surgery on Fridays, which is the day for July 21.

## 2015-12-25 NOTE — Telephone Encounter (Signed)
I left patient a message to give me a call back in regards to appointment scheduled for July 11.

## 2015-12-28 NOTE — Telephone Encounter (Signed)
I left patient a message that I am going to cancel appointment scheduled for July [facility].  Please call if you would like to reschedule.

## 2016-01-11 ENCOUNTER — Encounter: Payer: Self-pay | Admitting: Podiatry

## 2016-03-14 ENCOUNTER — Encounter (HOSPITAL_COMMUNITY): Payer: Self-pay

## 2016-03-14 ENCOUNTER — Emergency Department (HOSPITAL_COMMUNITY)
Admission: EM | Admit: 2016-03-14 | Discharge: 2016-03-14 | Disposition: A | Discharge status: Home / Self Care | Payer: Managed Care, Other (non HMO) | Attending: Emergency Medicine | Admitting: Emergency Medicine

## 2016-03-14 DIAGNOSIS — K148 Other diseases of tongue: Secondary | ICD-10-CM | POA: Insufficient documentation

## 2016-03-14 DIAGNOSIS — F17210 Nicotine dependence, cigarettes, uncomplicated: Secondary | ICD-10-CM | POA: Diagnosis not present

## 2016-03-14 DIAGNOSIS — Z21 Asymptomatic human immunodeficiency virus [HIV] infection status: Secondary | ICD-10-CM | POA: Insufficient documentation

## 2016-03-14 DIAGNOSIS — K149 Disease of tongue, unspecified: Secondary | ICD-10-CM

## 2016-03-14 MED ORDER — LIDOCAINE VISCOUS 2 % MT SOLN: 10.0000 mL | OROMUCOSAL | 0 refills | Status: DC | PRN

## 2016-03-14 NOTE — ED Provider Notes (Signed)
[facility]-EMERGENCY DEPT Provider Note   CSN: [id number] Arrival date & time: 03/14/16  1148  By signing my name below, I, Walter Ferguson, attest that this documentation has been prepared under the direction and in the presence of [facility] Benita Boonstra, PA-C.  Electronically Signed: Octavia HeirArianna Ferguson, ED Scribe. 03/14/16. 1:27 PM.    History   Chief Complaint Chief Complaint  Patient presents with  . Tongue Irritation    The history is provided by the patient. No language interpreter was used.   HPI Comments: Walter Ferguson is a 25 y.o. male who has a PMhx of HIV presents to the Emergency Department complaining of sudden onset, moderate, unchanged tongue irritation onset this morning. Pt says he is unsure what he consumed to cause the irritation. He states he has been drinking more orange juice lately (3-5 cartons in the past few days), is also eating a lot of candy. Pt is currently compliant with all of his medication and his CD4 count is "good" and his viral load is undetectable. Denies fever, sore throat, congestion, abdominal pain, vomiting, burping, or GERD.  Past Medical History:  Diagnosis Date  . HIV (human immunodeficiency virus infection) (HCC)     There are no active problems to display for this patient.   Past Surgical History:  Procedure Laterality Date  . WISDOM TOOTH EXTRACTION         Home Medications    Prior to Admission medications   Medication Sig Start Date End Date Taking? Authorizing Provider  diltiazem 2 % GEL Apply 1 application topically 2 (two) times daily. Patient not taking: Reported on 04/15/2015 06/11/13   Raeford RazorStephen Kohut, MD  lidocaine (XYLOCAINE) 2 % solution Use as directed 10 mLs in the mouth or throat as needed for mouth pain. Swish and spit 03/14/16   Trixie DredgeEmily Asiel Chrostowski, PA-C    Family History History reviewed. No pertinent family history.  Social History Social History  Substance Use Topics  . Smoking status: Current Some Day Smoker    Types: Cigarettes    . Smokeless tobacco: Never Used  . Alcohol use 0.0 oz/week     Comment: occ     Allergies   Review of patient's allergies indicates no known allergies.   Review of Systems Review of Systems  Constitutional: Negative for activity change, appetite change, chills and fever.  HENT: Negative for congestion, dental problem, ear pain, sore throat and trouble swallowing.        Tongue irritation  Respiratory: Negative for cough and shortness of breath.   Gastrointestinal: Negative for abdominal pain and nausea.  Musculoskeletal: Negative for neck pain and neck stiffness.  Psychiatric/Behavioral: Negative for self-injury.     Physical Exam Updated Vital Signs BP 140/88 (BP Location: Right Arm)   Pulse 78   Temp 98.4 F (36.9 C) (Oral)   Resp 16   Ht 5\' 9"  (1.753 m)   Wt 55.8 kg   SpO2 100%   BMI 18.16 kg/m   Physical Exam  Constitutional: He appears well-developed and well-nourished. No distress.  HENT:  Head: Normocephalic and atraumatic.  Mouth/Throat: Oropharynx is clear and moist. No oropharyngeal exudate.  Tongue with normal white coating, few small pink round areas toward front of tongue without coating.  No edema.  No erythema, edema, exudate of the throat.  No palpable masses under the tongue.  Teeth remarkable for significant plaque buildup.  Eyes: Conjunctivae and EOM are normal. Right eye exhibits no discharge. Left eye exhibits no discharge.  Neck: Normal range of  motion. Neck supple.  Cardiovascular: Normal rate and regular rhythm.   Pulmonary/Chest: Effort normal and breath sounds normal. No stridor. No respiratory distress. He has no wheezes. He has no rales.  Lymphadenopathy:    He has no cervical adenopathy.  Neurological: He is alert.  Skin: He is not diaphoretic.  Nursing note and vitals reviewed.    ED Treatments / Results  DIAGNOSTIC STUDIES: Oxygen Saturation is 100% on RA, normal by my interpretation.  COORDINATION OF CARE:  1:20 PM Discussed  treatment plan with pt at bedside and pt agreed to plan.  Labs (all labs ordered are listed, but only abnormal results are displayed) Labs Reviewed - No data to display  EKG  EKG Interpretation None       Radiology No results found.  Procedures Procedures (including critical care time)  Medications Ordered in ED Medications - No data to display   Initial Impression / Assessment and Plan / ED Course  I have reviewed the triage vital signs and the nursing notes.  Pertinent labs & imaging results that were available during my care of the patient were reviewed by me and considered in my medical decision making (see chart for details).  Clinical Course   Afebrile, nontoxic patient with tongue irritation.  Pt is HIV positive but compliant with meds, reports his numbers are currently very good.  Does not appear clinically to be thrush. No other associated symptoms.  Pt has had increase in candy and acidic foods recently, unsure of other food changes.   D/C home with viscous lidocaine, return precautions, f/u with PCP if not resolving in 3 days.  Discussed result, findings, treatment, and follow up  with patient.  Pt given return precautions.  Pt verbalizes understanding and agrees with plan.      I personally performed the services described in this documentation, which was scribed in my presence. The recorded information has been reviewed and is accurate.  Final Clinical Impressions(s) / ED Diagnoses   Final diagnoses:  Tongue irritation    New Prescriptions Discharge Medication List as of 03/14/2016  1:37 PM    START taking these medications   Details  lidocaine (XYLOCAINE) 2 % solution Use as directed 10 mLs in the mouth or throat as needed for mouth pain. Swish and spit, Starting Thu 03/14/2016, Print         [place], [place] 03/14/16 1414    Canary Brim Tegeler, MD 03/14/16 [phone]

## 2016-03-14 NOTE — ED Triage Notes (Signed)
Pt c/o tongue irritation starting this morning.  Denies pain.  Pt sts "it feels like when you burn your tongue w/ hot coffee or something."  Redness noted.

## 2016-03-14 NOTE — Discharge Instructions (Signed)
Read the information below.  Use the prescribed medication as directed.  Please discuss all new medications with your pharmacist.  You may return to the Emergency Department at any time for worsening condition or any new symptoms that concern you.   If you develop high fevers, difficulty swallowing or breathing, or you are unable to tolerate fluids by mouth, return to the ER immediately for a recheck.    °

## 2016-06-10 ENCOUNTER — Ambulatory Visit: Payer: Managed Care, Other (non HMO)

## 2016-06-10 ENCOUNTER — Ambulatory Visit (INDEPENDENT_AMBULATORY_CARE_PROVIDER_SITE_OTHER): Payer: Managed Care, Other (non HMO) | Admitting: Physician Assistant

## 2016-06-10 VITALS — BP 122/72 | HR 97 | Temp 98.2°F | Resp 17 | Ht 69.0 in | Wt 126.0 lb

## 2016-06-10 DIAGNOSIS — Z113 Encounter for screening for infections with a predominantly sexual mode of transmission: Secondary | ICD-10-CM

## 2016-06-10 LAB — POCT URINALYSIS DIP (MANUAL ENTRY)
BILIRUBIN UA: NEGATIVE
Blood, UA: NEGATIVE
Glucose, UA: NEGATIVE
LEUKOCYTES UA: NEGATIVE
Nitrite, UA: NEGATIVE
PH UA: 5.5
Protein Ur, POC: NEGATIVE
Spec Grav, UA: >=1.03
Urobilinogen, UA: 1

## 2016-06-10 LAB — POC MICROSCOPIC URINALYSIS (UMFC): Mucus: ABSENT

## 2016-06-10 NOTE — Patient Instructions (Addendum)
Use condoms every single time, with all types of sex (oral, anal and vaginal).    IF you received an x-ray today, you will receive an invoice from [facility]. Please contact [facility] at [phone] with questions or concerns regarding your invoice.   IF you received labwork today, you will receive an invoice from [facility]. Please contact [facility] at [phone] with questions or concerns regarding your invoice.   Our billing staff will not be able to assist you with questions regarding bills from these companies.  You will be contacted with the lab results as soon as they are available. The fastest way to get your results is to activate your My Chart account. Instructions are located on the last page of this paperwork. If you have not heard from us regarding the results in 2 weeks, please contact this office.

## 2016-06-10 NOTE — Progress Notes (Signed)
Patient ID: Walter Ferguson, male     DOB: 11/13/1990, 25 y.o.    MRN: [id number]  PCP: Default, Provider, MD  Chief Complaint  Patient presents with  . Exposure to STD    Subjective:   This patient is new to this practice and presents for evaluation of STI screening. He is unaccompanied.  Desires STI screening.  Exchanged oral sex with a male partner who has notified him that he may have an STD. Condom use is inconsistent. He is asymptomatic. He does not know whether his partner had/has any symptoms.  Last STI screening was about 2 months ago. History of syphilis (early-latent). [facility].  He reports that as a result of the syphilis diagnosis, he no longer engages in anal sex. He reveals that he does not use condoms with "head."  [facility], and significant history not revealed by the patient noted. He also has a history of Chlamydia, and has HIV disease. [facility] and was positive for chlamydia and syphilis (positive RPR 1:4) and it wasn't clear if the syphilis was new or persistent. He was treated with azithromycin and injection of penicillin. Next follow-up is in 08/2016.  Medical history, immunizations, current medications and active problem list were reconciled from this outside records access, and not initially shared by the patient.   Review of Systems  Constitutional: Negative for activity change, appetite change, chills, diaphoresis, fatigue, fever and unexpected weight change.  HENT: Negative for sore throat, trouble swallowing and voice change.   Respiratory: Negative for cough.   Cardiovascular: Negative for palpitations and leg swelling.  Gastrointestinal: Negative for constipation, diarrhea, nausea and rectal pain.  Endocrine: Negative for polydipsia, polyphagia and polyuria.  Genitourinary: Negative for decreased urine volume, difficulty urinating, discharge, dysuria, enuresis, flank  pain, frequency, genital sores, hematuria, penile pain, penile swelling, scrotal swelling, testicular pain and urgency.  Musculoskeletal: Negative for arthralgias and back pain.  Allergic/Immunologic: Positive for immunocompromised state.  Hematological: Negative for adenopathy. Does not bruise/bleed easily.     Prior to Admission medications   Medication Sig Start Date End Date Taking? Authorizing Provider  TIVICAY 50 MG tablet  04/26/16   Historical Provider, MD  TRUVADA 200-300 MG tablet  04/26/16   Historical Provider, MD      No Known Allergies   There are no active problems to display for this patient.    No family history on file.   Social History   Social History  . Marital status: Single    Spouse name: N/A  . Number of children: N/A  . Years of education: N/A   Occupational History  . Unloader/stock     Engineer, [employer]  . student- [employer]     [employer]   Social History Main Topics  . Smoking status: Current Some Day Smoker    Types: Cigarettes  . Smokeless tobacco: Never Used  . Alcohol use 0.0 oz/week     Comment: occ  . Drug use:     Types: Marijuana  . Sexual activity: No   Other Topics Concern  . Not on file   Social History Narrative   Lives with roommates.   Uncle lives nearby.   [place]         Objective:  Physical Exam  Constitutional: He is oriented to person, place, and time. He appears well-developed and well-nourished. He is active and cooperative. No distress.  BP 122/72 (BP Location: Right Arm, Patient Position: Sitting, Cuff Size: Normal)   Pulse 97   Temp 98.2 F (36.8 C) (Oral)   Resp 17   Ht 5\' 9"  (1.753 m)   Wt 126 lb (57.2 kg)   SpO2 97%   BMI 18.61 kg/m    Eyes: Conjunctivae are normal.  Pulmonary/Chest: Effort normal.  Neurological: He is alert and oriented to person, place, and time.  Psychiatric: He has a normal mood and affect. His speech is normal and behavior is  normal.             Assessment & Plan:  1. Routine screening for STI (sexually transmitted infection) Concerned that this patient did not initially think that his HIV disease and treatment, recent syphilis and chlamydia, was important to share, and inconsistent condom use. He also required explanation for "penile discharge" and "asymptomatic." Expect positive RPR. Will forward results to patient's PCP. Encouraged consistent condom or other barrier device use. - POCT Microscopic Urinalysis ([facility]) - POCT urinalysis dipstick - GC/Chlamydia Probe Amp - RPR - T.pallidum Ab, Total   Fernande Brashelle S. Renleigh Ouellet, PA-C Physician Assistant-Certified [facility]

## 2016-06-12 LAB — GC/CHLAMYDIA PROBE AMP
Chlamydia trachomatis, NAA: NEGATIVE
Neisseria gonorrhoeae by PCR: NEGATIVE

## 2016-06-12 LAB — T.PALLIDUM AB, TOTAL: T Pallidum Abs: POSITIVE — AB

## 2016-06-12 LAB — RPR: RPR: REACTIVE — AB

## 2016-06-12 LAB — RPR, QUANT+TP ABS (REFLEX)

## 2016-06-14 ENCOUNTER — Encounter: Payer: Self-pay | Admitting: Physician Assistant

## 2016-07-15 ENCOUNTER — Emergency Department (HOSPITAL_COMMUNITY)
Admission: EM | Admit: 2016-07-15 | Discharge: 2016-07-15 | Disposition: A | Discharge status: Home / Self Care | Payer: BLUE CROSS/BLUE SHIELD | Attending: Emergency Medicine | Admitting: Emergency Medicine

## 2016-07-15 ENCOUNTER — Encounter (HOSPITAL_COMMUNITY): Payer: Self-pay

## 2016-07-15 ENCOUNTER — Emergency Department (HOSPITAL_COMMUNITY): Payer: BLUE CROSS/BLUE SHIELD

## 2016-07-15 DIAGNOSIS — F17210 Nicotine dependence, cigarettes, uncomplicated: Secondary | ICD-10-CM | POA: Diagnosis not present

## 2016-07-15 DIAGNOSIS — J029 Acute pharyngitis, unspecified: Secondary | ICD-10-CM | POA: Diagnosis not present

## 2016-07-15 DIAGNOSIS — R6889 Other general symptoms and signs: Secondary | ICD-10-CM

## 2016-07-15 LAB — RAPID STREP SCREEN (MED CTR MEBANE ONLY): Streptococcus, Group A Screen (Direct): NEGATIVE

## 2016-07-15 MED ORDER — OSELTAMIVIR PHOSPHATE 75 MG PO CAPS: 75.0000 mg | ORAL_CAPSULE | Freq: Two times a day (BID) | ORAL | 0 refills | Status: AC

## 2016-07-15 NOTE — Discharge Instructions (Signed)
Please read and follow all provided instructions.  Your diagnoses today include:  1. Flu-like symptoms     Tests performed today include: Vital signs. See below for your results today.   Medications prescribed:   Take any prescribed medications only as directed.  Home care instructions:  Follow any educational materials contained in this packet. Please continue drinking plenty of fluids. Use over-the-counter cold and flu medications as needed as directed on packaging for symptom relief. You may also use ibuprofen or tylenol as directed on packaging for pain or fever.   BE VERY CAREFUL not to take multiple medicines containing Tylenol (also called acetaminophen). Doing so can lead to an overdose which can damage your liver and cause liver failure and possibly death.   Follow-up instructions: Please follow-up with your primary care provider in the next 3 days for further evaluation of your symptoms.   Return instructions:  Please return to the Emergency Department if you experience worsening symptoms. Please return if you have a high fever greater than 101 degrees not controlled with over-the-counter medications, persistent vomiting and cannot keep down fluids, or worsening trouble breathing. Please return if you have any other emergent concerns.  Additional Information:  Your vital signs today were: BP (!) 129/104 (BP Location: Left Arm)    Pulse 106    Temp 99.9 F (37.7 C) (Oral)    Resp 18    Ht 5\' 8"  (1.727 m)    Wt 57.2 kg    SpO2 99%    BMI 19.16 kg/m  If your blood pressure (BP) was elevated above 135/85 this visit, please have this repeated by your doctor within one month.

## 2016-07-15 NOTE — ED Provider Notes (Signed)
[facility]-EMERGENCY DEPT Provider Note   CSN: [id number] Arrival date & time: 07/15/16  1630   By signing my name below, I, Soijett Blue, attest that this documentation has been prepared under the direction and in the presence of Audry Pili, PA-C Electronically Signed: Soijett Blue, ED Scribe. 07/15/16. 5:59 PM.   History   Chief Complaint Chief Complaint  Patient presents with  . Sore Throat  . Chills    HPI Walter Ferguson is a 26 y.o. male with a PMHx of HIV, who presents to the Emergency Department complaining of sore throat onset yesterday. Pt notes that he did receive the flu vaccine last year. He is having gradual onset associated symptoms of painful swallowing, non-productive cough, chills, and chest wall tenderness due to cough. He hasn't tried any medications for the relief of his symptoms. He denies fever, chills, rhinorrhea, nasal congestion, nausea, vomiting, diarrhea, abdominal pain, and any other symptoms. Pt notes that his viral load is undetectable and that he takes his daily antiviral medications. He denies sick contacts, but notes that he works at [employer].  The history is provided by the patient. No language interpreter was used.    Past Medical History:  Diagnosis Date  . HIV (human immunodeficiency virus infection) [facility])     Patient Active Problem List   Diagnosis Date Noted  . 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 Asymptomatic HIV infection, CD4 >=500 (HCC) 11/28/2011  . Prediabetes 11/28/2011    Past Surgical History:  Procedure Laterality Date  . WISDOM TOOTH EXTRACTION         Home Medications    Prior to Admission medications   Medication Sig Start Date End Date Taking? Authorizing Provider  TIVICAY 50 MG tablet  04/26/16   Historical Provider, MD  TRUVADA 200-300 MG tablet  04/26/16   Historical Provider, MD    Family History History reviewed. No pertinent family history.  Social History Social History  Substance Use Topics  . Smoking status:  Current Some Day Smoker    Types: Cigarettes  . Smokeless tobacco: Never Used  . Alcohol use 0.0 oz/week     Comment: occ     Allergies   Patient has no known allergies.   Review of Systems Review of Systems  Constitutional: Positive for chills.  HENT: Positive for sore throat. Negative for congestion and rhinorrhea.        +painful swallowing  Respiratory: Positive for cough.        +chest wall tenderness  Gastrointestinal: Negative for abdominal pain, diarrhea, nausea and vomiting.   A complete 10 system review of systems was obtained and all systems are negative except as noted in the HPI and PMH.   Physical Exam Updated Vital Signs BP (!) 129/104 (BP Location: Left Arm)   Pulse 106   Temp 99.9 F (37.7 C) (Oral)   Resp 18   Ht 5\' 8"  (1.727 m)   Wt 126 lb (57.2 kg)   SpO2 99%   BMI 19.16 kg/m   Physical Exam  Constitutional: He is oriented to person, place, and time. He appears well-developed and well-nourished. No distress.  HENT:  Head: Normocephalic and atraumatic.  Right Ear: Tympanic membrane, external ear and ear canal normal.  Left Ear: Tympanic membrane, external ear and ear canal normal.  Nose: Nose normal.  Mouth/Throat: Uvula is midline, oropharynx is clear and moist and mucous membranes are normal. No trismus in the jaw. No oropharyngeal exudate, posterior oropharyngeal edema, posterior oropharyngeal erythema or tonsillar abscesses.  Eyes:  EOM are normal. Pupils are equal, round, and reactive to light.  Neck: Normal range of motion. Neck supple. No tracheal deviation present.  Cardiovascular: Regular rhythm, S1 normal, S2 normal, normal heart sounds, intact distal pulses and normal pulses.  Tachycardia present.  Exam reveals no gallop and no friction rub.   No murmur heard. tachycardic  Pulmonary/Chest: Effort normal and breath sounds normal. No respiratory distress. He has no decreased breath sounds. He has no wheezes. He has no rhonchi. He has no  rales.  Abdominal: Normal appearance and bowel sounds are normal. He exhibits no distension. There is no tenderness.  Musculoskeletal: Normal range of motion.  Neurological: He is alert and oriented to person, place, and time.  Skin: Skin is warm and dry.  Psychiatric: He has a normal mood and affect. His speech is normal and behavior is normal. Thought content normal.  Nursing note and vitals reviewed.  ED Treatments / Results  DIAGNOSTIC STUDIES: Oxygen Saturation is 99% on RA, nl by my interpretation.    COORDINATION OF CARE: 5:57 PM Discussed treatment plan with pt at bedside which includes rapid strep screen and culture, CXR, and pt agreed to plan.  Labs (all labs ordered are listed, but only abnormal results are displayed) Labs Reviewed  RAPID STREP SCREEN (NOT AT [facility])  CULTURE, GROUP A STREP [facility])   Radiology Dg Chest 2 View  Result Date: 07/15/2016 CLINICAL DATA:  Cough EXAM: CHEST  2 VIEW COMPARISON:  None. FINDINGS: The heart size and mediastinal contours are within normal limits. Both lungs are clear. The visualized skeletal structures show mild scoliosis in the thoracic spine. IMPRESSION: No active cardiopulmonary disease. Electronically Signed   By: Alcide Clever M.D.   On: 07/15/2016 19:30    Procedures Procedures (including critical care time)  Medications Ordered in ED Medications - No data to display   Initial Impression / Assessment and Plan / ED Course  I have reviewed the triage vital signs and the nursing notes.  Pertinent labs & imaging results that were available during my care of the patient were reviewed by me and considered in my medical decision making (see chart for details).    I have reviewed and evaluated the relevant laboratory values. I have reviewed and evaluated the relevant imaging studies. I have reviewed the relevant previous healthcare records. I obtained HPI from historian.  ED Course:  Assessment: Pt is a 25yM with hx HIV who  presents with URi symptoms x 2 days. Subjective fevers. No N/V/D. On exam, pt in NAD. Nontoxic/nonseptic appearing. VS with slight tachycardia. Afebrile. Lungs CTA. Heart RRR. Abdomen nontender soft. Rapid strep negative. CXR negative. Given high risk with HIV, will treat for Flu with Tamiflu. Plan is to DC home with follow up to PCP. At time of discharge, Patient is in no acute distress. Vital Signs are stable. Patient is able to ambulate. Patient able to tolerate PO.   Disposition/Plan:  DC Hom Additional Verbal discharge instructions given and discussed with patient.  Pt Instructed to f/u with PCP in the next week for evaluation and treatment of symptoms. Return precautions given Pt acknowledges and agrees with plan  Supervising Physician Rolland Porter, MD  Final Clinical Impressions(s) / ED Diagnoses   Final diagnoses:  Flu-like symptoms    New Prescriptions New Prescriptions   No medications on file   I personally performed the services described in this documentation, which was scribed in my presence. The recorded information has been reviewed and is accurate.  Audry Piliyler Judie Hollick, PA-C 07/15/16 1936    Rolland PorterMark James, MD 07/25/16 [phone]

## 2016-07-15 NOTE — ED Triage Notes (Addendum)
Pt reports cough and sore throat X2 days. He reports he has nasal congestion as well. Pt has hx of HIV but is up to date on meds and states his HIV is currently undetectable.

## 2016-07-15 NOTE — ED Notes (Signed)
Gave pt peanut butter and Graham crackers and orange juice.

## 2016-07-18 LAB — CULTURE, GROUP A STREP (THRC)

## 2017-10-13 ENCOUNTER — Encounter (HOSPITAL_COMMUNITY): Payer: Self-pay | Admitting: Emergency Medicine

## 2017-10-13 ENCOUNTER — Emergency Department (HOSPITAL_COMMUNITY)
Admission: EM | Admit: 2017-10-13 | Discharge: 2017-10-13 | Disposition: A | Discharge status: Home / Self Care | Payer: Managed Care, Other (non HMO) | Attending: Emergency Medicine | Admitting: Emergency Medicine

## 2017-10-13 DIAGNOSIS — F17210 Nicotine dependence, cigarettes, uncomplicated: Secondary | ICD-10-CM | POA: Insufficient documentation

## 2017-10-13 DIAGNOSIS — Z202 Contact with and (suspected) exposure to infections with a predominantly sexual mode of transmission: Secondary | ICD-10-CM | POA: Diagnosis not present

## 2017-10-13 DIAGNOSIS — Z21 Asymptomatic human immunodeficiency virus [HIV] infection status: Secondary | ICD-10-CM | POA: Diagnosis not present

## 2017-10-13 DIAGNOSIS — J020 Streptococcal pharyngitis: Secondary | ICD-10-CM | POA: Insufficient documentation

## 2017-10-13 DIAGNOSIS — Z79899 Other long term (current) drug therapy: Secondary | ICD-10-CM | POA: Insufficient documentation

## 2017-10-13 DIAGNOSIS — Z113 Encounter for screening for infections with a predominantly sexual mode of transmission: Secondary | ICD-10-CM

## 2017-10-13 DIAGNOSIS — J029 Acute pharyngitis, unspecified: Secondary | ICD-10-CM | POA: Diagnosis present

## 2017-10-13 LAB — GROUP A STREP BY PCR: GROUP A STREP BY PCR: DETECTED — AB

## 2017-10-13 MED ORDER — PENICILLIN G BENZATHINE 1200000 UNIT/2ML IM SUSP
2.4000 10*6.[IU] | Freq: Once | INTRAMUSCULAR | Status: AC
  Administered 2017-10-13: 2.4 10*6.[IU] via INTRAMUSCULAR
  Filled 2017-10-13: qty 4

## 2017-10-13 MED ORDER — FLUTICASONE PROPIONATE 50 MCG/ACT NA SUSP: 2.0000 | Freq: Every day | NASAL | 0 refills | Status: AC

## 2017-10-13 MED ORDER — PENICILLIN G BENZATHINE 1200000 UNIT/2ML IM SUSP: 1.2000 10*6.[IU] | Freq: Once | INTRAMUSCULAR | Status: DC

## 2017-10-13 NOTE — ED Provider Notes (Signed)
[facility]-EMERGENCY DEPT Provider Note   CSN: [id number] Arrival date & time: 10/13/17  1118     History   Chief Complaint Chief Complaint  Patient presents with  . Sore Throat    HPI Walter Ferguson is a 27 y.o. male with history of HIV presents today for evaluation of sore throat and nasal congestion for 3 days.  He denies fevers or chills.  He denies drooling, facial swelling, or throat tightness.  He endorses odynophagia.  He has been drinking a honey tea that has been helpful for his sore throat.  He notes facial pressure/sinus pain causing a headache. States headache feels like his usual headaches. No recent trauma or falls.  Denies vision changes, numbness, tingling, weakness.  He states he thinks his symptoms began the day after he was kissing somebody.  He is unsure if this person had similar symptoms.  He is also requesting STD testing.  He underwent STD testing and treatment when he last followed up with his infectious disease doctor on 09/18/17.  He denies any new partners but states he is unsure if his current partner is monogamous.  He denies any genital lesions, dysuria, urinary symptoms, pain with bowel movements, testicular pain, scrotal swelling, or penile pain.  He tells me he has been compliant with his HIV medicine and has had an undetectable viral load for years.  The history is provided by the patient.    Past Medical History:  Diagnosis Date  . HIV (human immunodeficiency virus infection) [facility])     Patient Active Problem List   Diagnosis Date Noted  . 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 Asymptomatic HIV infection, CD4 >=500 (HCC) 11/28/2011  . Prediabetes 11/28/2011    Past Surgical History:  Procedure Laterality Date  . WISDOM TOOTH EXTRACTION          Home Medications    Prior to Admission medications   Medication Sig Start Date End Date Taking? Authorizing Provider  fluticasone (FLONASE) 50 MCG/ACT nasal spray Place 2 sprays  into both nostrils daily. 10/13/17   Luevenia Maxin, Hermione Havlicek A, PA-C  oseltamivir (TAMIFLU) 75 MG capsule Take 1 capsule (75 mg total) by mouth every 12 (twelve) hours. 07/15/16   Audry Pili, PA-C  TIVICAY 50 MG tablet  04/26/16   [provider]  TRUVADA 200-300 MG tablet  04/26/16   [provider]    Family History No family history on file.  Social History Social History   Tobacco Use  . Smoking status: Current Some Day Smoker    Types: Cigarettes  . Smokeless tobacco: Never Used  Substance Use Topics  . Alcohol use: Yes    Alcohol/week: 0.0 oz    Comment: occ  . Drug use: Yes    Types: Marijuana     Allergies   Patient has no known allergies.   Review of Systems Review of Systems  Constitutional: Negative for chills and fever.  HENT: Positive for congestion, sinus pressure and sore throat. Negative for drooling.   Respiratory: Negative for shortness of breath.   Cardiovascular: Negative for chest pain.  Gastrointestinal: Negative for abdominal pain, diarrhea, nausea and vomiting.  Neurological: Positive for headaches.  All other systems reviewed and are negative.    Physical Exam Updated Vital Signs BP 128/77 (BP Location: Left Arm)   Pulse 95   Temp 99 F (37.2 C) (Oral)   Resp 12   Ht 5\' 8"  (1.727 m)   Wt 57.2 kg (126 lb)   SpO2  99%   BMI 19.16 kg/m   Physical Exam  Constitutional: He appears well-developed and well-nourished.  Non-toxic appearance. He does not appear ill. No distress.  HENT:  Head: Normocephalic and atraumatic.  Right Ear: Tympanic membrane and ear canal normal. No drainage, swelling or tenderness.  Left Ear: Tympanic membrane and ear canal normal. No drainage, swelling or tenderness.  Mouth/Throat: Uvula is midline and mucous membranes are normal. No uvula swelling. Posterior oropharyngeal erythema present. No oropharyngeal exudate, posterior oropharyngeal edema or tonsillar abscesses. Tonsils are 2+ on the right. Tonsils are 2+  on the left. Tonsillar exudate.  Nasal septum midline with mucosal edema bilaterally.  No frontal or maxillary sinus tenderness.  No trismus or sublingual abnormalities noted.  Tolerating secretions without difficulty.  Eyes: Conjunctivae are normal. Right eye exhibits no discharge. Left eye exhibits no discharge.  Neck: Normal range of motion and full passive range of motion without pain. Neck supple. No JVD present. No tracheal deviation present.  Cardiovascular: Normal rate and regular rhythm.  Pulmonary/Chest: Effort normal and breath sounds normal. No stridor. No respiratory distress. He has no wheezes. He has no rhonchi. He has no rales. He exhibits no tenderness.  Abdominal: Soft. Bowel sounds are normal. He exhibits no distension. There is no tenderness.  Genitourinary: Penis normal.  Genitourinary Comments: Examination performed in the presence of a chaperone. No inguinal lymphadenopathy, no lesions or ulcerations to the genitalia, no penile drainage, erythema, or swelling. No scrotal swelling, erythema, or pain. No masses on palpation.    Musculoskeletal: He exhibits no edema.  Lymphadenopathy:    He has no cervical adenopathy.  Neurological: He is alert.  Skin: Skin is warm and dry. No erythema.  Psychiatric: He has a normal mood and affect. His behavior is normal.  Nursing note and vitals reviewed.    ED Treatments / Results  Labs (all labs ordered are listed, but only abnormal results are displayed) Labs Reviewed  GROUP A STREP BY PCR - Abnormal; Notable for the following components:      Result Value   Group A Strep by PCR DETECTED (*)    All other components within normal limits  GC/CHLAMYDIA PROBE AMP ([place]) NOT [facility]    EKG None  Radiology No results found.  Procedures Procedures (including critical care time)  Medications Ordered in ED Medications  penicillin g benzathine (BICILLIN LA) 1200000 UNIT/2ML injection 2.4 Million Units (2.4 Million Units  Intramuscular Given 10/13/17 1434)     Initial Impression / Assessment and Plan / ED Course  I have reviewed the triage vital signs and the nursing notes.  Pertinent labs & imaging results that were available during my care of the patient were reviewed by me and considered in my medical decision making (see chart for details).     Patient with history of HIV presents for evaluation of sore throat for 3 days and also requests STD check.  He is afebrile, vital signs are stable.  He is nontoxic in appearance.  Rapid strep was positive.  No signs of PTA, Ludwig's, or spread of infection to soft tissue.  He is tolerating secretions without difficulty.  He does not appear dehydrated.  He tells me that his viral load has been undetectable for years but using care everywhere, it appears his last viral load was in fact detectable as was his viral load in October.  Chart review of his last visit with his infectious disease doctor in March shows that he tested positive for  syphilis.  He had no symptoms at that time but there was concern for possible syphilis reinfection.  He had positive gonorrhea and chlamydia samples 09/02/17 as well.  He was treated with 2,400,000 units of penicillin G on 09/18/17.  Discussed with Dr. Jacqulyn Bath who recommends administering another 2,400,000 units today for coverage of possible syphilis reinfection and strep today.  Patient declines syphilis testing today but request gonorrhea and Chlamydia testing.  He has no red flag signs concerning for prostatitis, proctitis, epididymitis, or orchitis.  He is overall asymptomatic.  Samples were obtained and he is aware that if any of his cultures come back positive he will be called and treatment will be sent to his pharmacy of choice.  I encouraged the patient to call his infectious disease doctor for follow-up this week for possible medication management and reevaluation.  Discussed symptom medic treatment of his sore throat and nasal congestion.   Discussed strict ED return precautions. Pt verbalized understanding of and agreement with plan and is safe for discharge home at this time.   Final Clinical Impressions(s) / ED Diagnoses   Final diagnoses:  Strep pharyngitis    ED Discharge Orders        Ordered    fluticasone (FLONASE) 50 MCG/ACT nasal spray  Daily     10/13/17 [address] PA-C 10/13/17 1624    Maia Plan, MD 10/13/17 1658

## 2017-10-13 NOTE — ED Triage Notes (Addendum)
Patient c/o sore throat, congestion, and headache x3 days. Denies N/V/D. Patient also requesting STD check. Denies penile discharge and pain.

## 2017-10-13 NOTE — Discharge Instructions (Addendum)
Alternate 600 mg of ibuprofen and [phone] mg of Tylenol every 3 hours as needed for pain. Do not exceed 4000 mg of Tylenol daily.  Take ibuprofen with food to avoid upset stomach issues.  Use warm water salt gargles, warm teas, throat lozenges for sore throat.  Use Flonase for nasal congestion.  Call your infectious disease doctor for reevaluation.  Your last viral load was detectable.  They also were unsure if you had a new syphilis infection or reinfection.  Return to the emergency department if any concerning signs or symptoms develop.

## 2017-10-14 ENCOUNTER — Telehealth: Payer: Self-pay | Admitting: Emergency Medicine

## 2017-10-14 LAB — GC/CHLAMYDIA PROBE AMP (~~LOC~~) NOT AT ARMC
Chlamydia: NEGATIVE
Neisseria Gonorrhea: NEGATIVE

## 2017-10-14 NOTE — Telephone Encounter (Signed)
Post ED Visit - Positive Culture Follow-up  Culture report reviewed by antimicrobial stewardship pharmacist:  [x]  Enzo BiNathan Batchelder, Pharm.D. []  Celedonio MiyamotoJeremy Frens, Pharm.D., BCPS AQ-ID []  Garvin FilaMike Maccia, Pharm.D., BCPS []  Georgina PillionElizabeth Martin, Pharm.D., BCPS []  EdnaMinh Pham, [address]Ph[address]D., BCPS, AAHIVP []  Estella HuskMichelle Turner, Pharm.D., BCPS, AAHIVP []  Lysle Pearlachel Rumbarger, PharmD, BCPS []  Blake DivineShannon Parkey, PharmD []  Pollyann SamplesAndy Johnston, PharmD, BCPS  Positive strep culture Treated with PCN, organism sensitive to the same and no further patient follow-up is required at this time.  Berle MullMiller, Alyzabeth Pontillo 10/14/2017, 9:49 AM

## 2017-12-08 IMAGING — DX DG CHEST 2V
2 series · 2 of 2 positions shown · non-contrast
Comparison: None.

CLINICAL DATA: Cough

EXAM:
CHEST  2 VIEW

[chest pa]
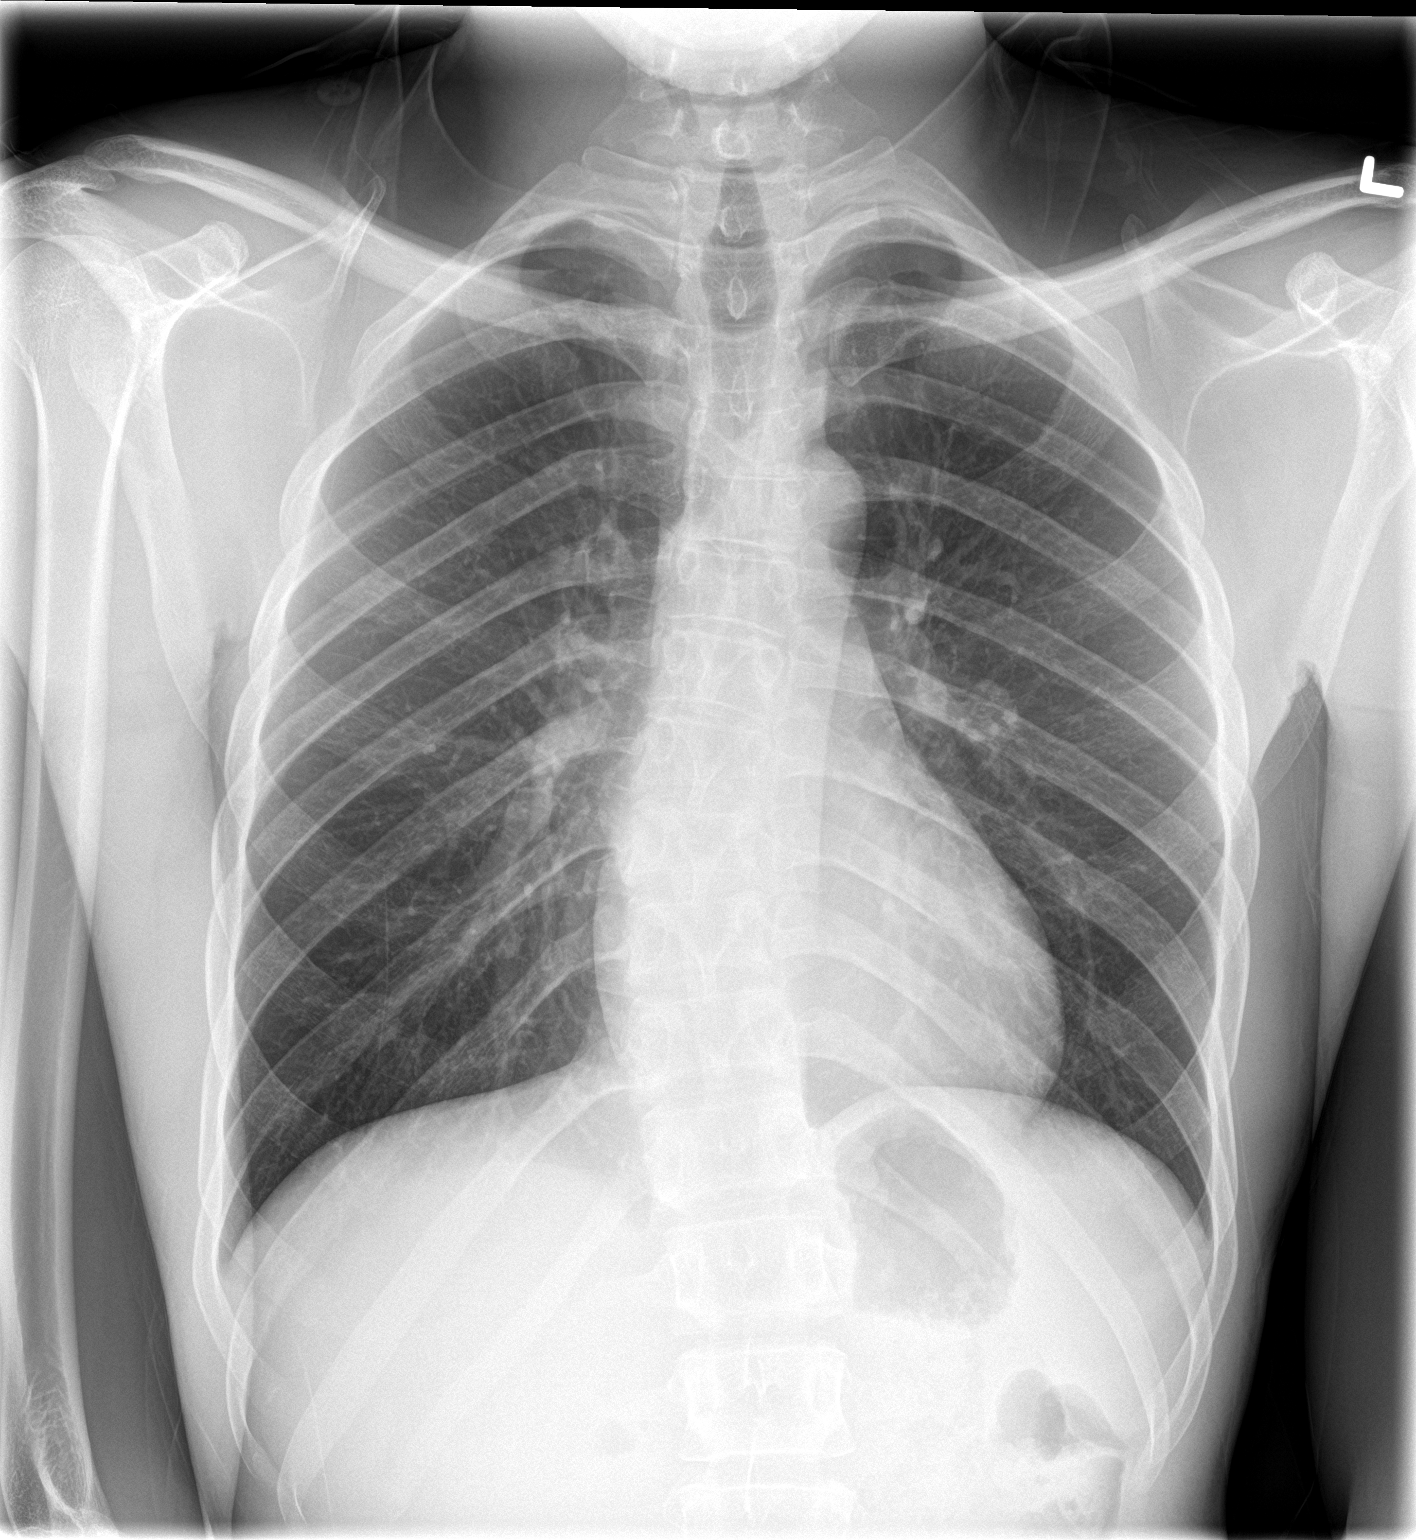

[chest lat]
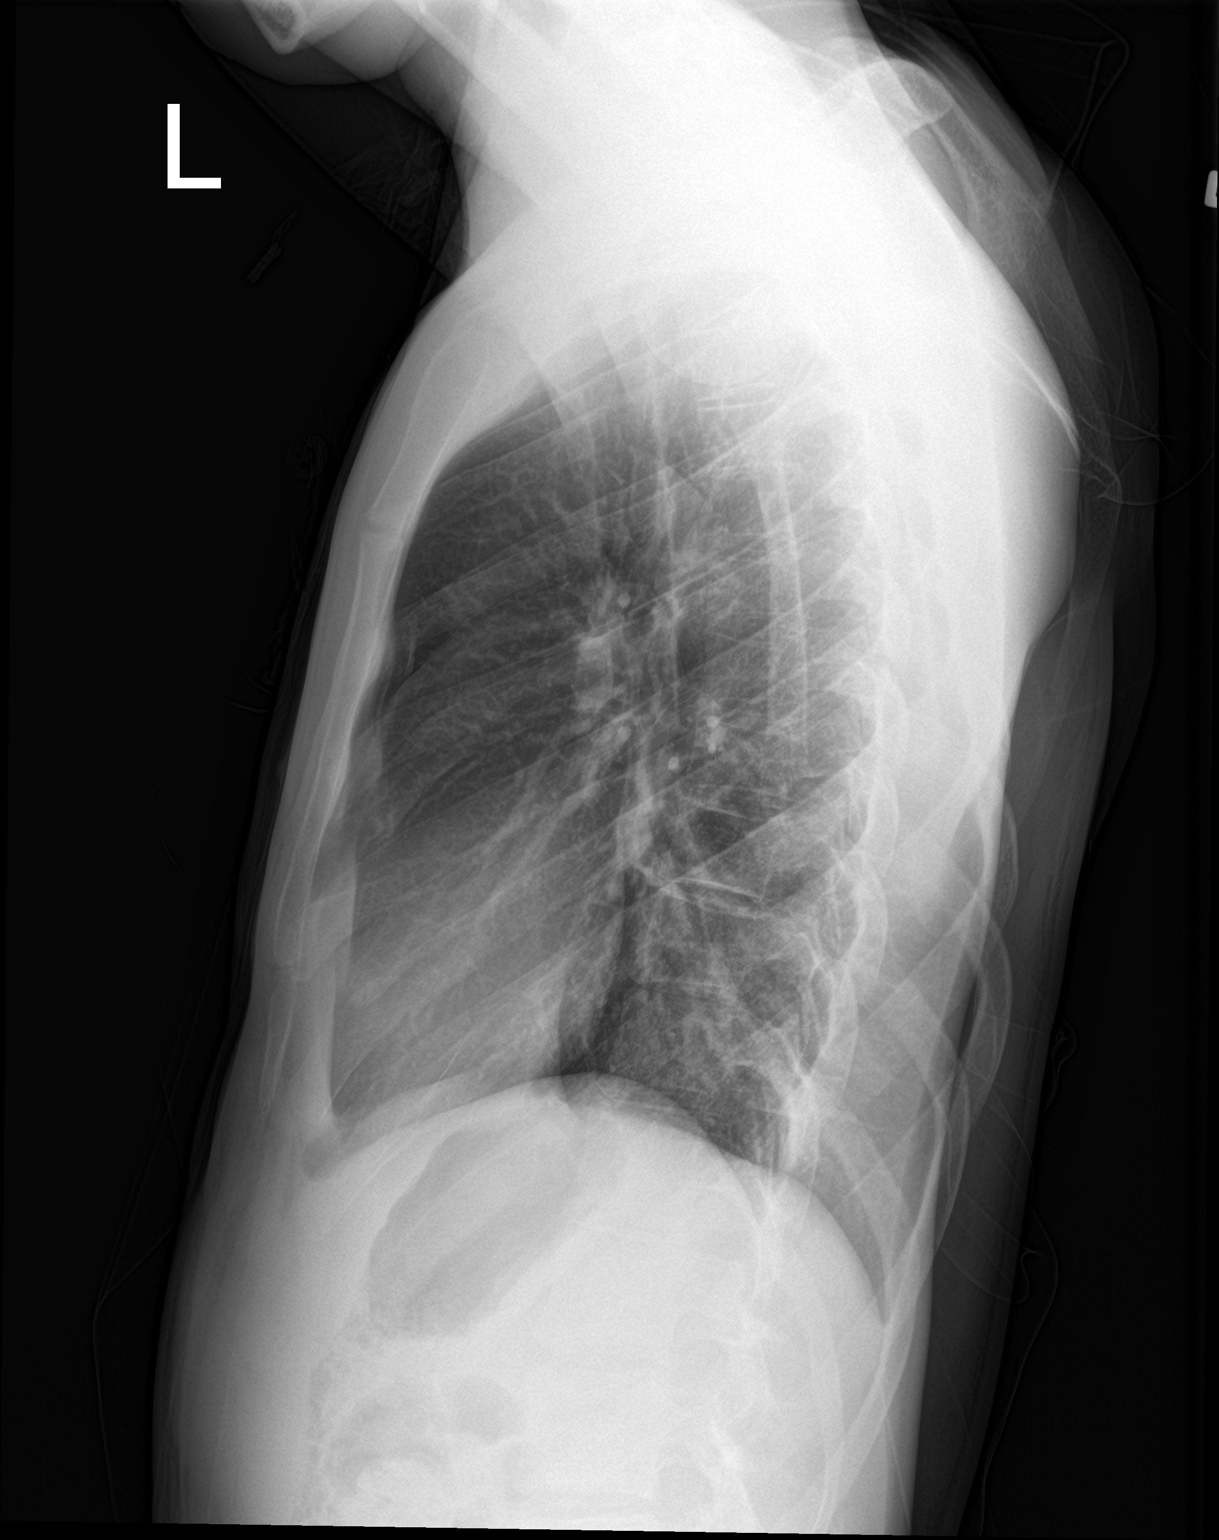

[2 of 2 positions shown; findings below may reference images not displayed]

FINDINGS: The heart size and mediastinal contours are within normal limits.
Both lungs are clear. The visualized skeletal structures show mild
scoliosis in the thoracic spine.
IMPRESSION: No active cardiopulmonary disease.
